# Patient Record
Sex: Female | Born: 1940 | Race: White | Hispanic: No | Smoking: Never smoker
Health system: Southern US, Community
[De-identification: ages and names within clinical notes are randomized; demographics above are authoritative.]

## PROBLEM LIST (undated history)

## (undated) DIAGNOSIS — E119 Type 2 diabetes mellitus without complications: Secondary | ICD-10-CM

## (undated) DIAGNOSIS — G4733 Obstructive sleep apnea (adult) (pediatric): Secondary | ICD-10-CM

## (undated) DIAGNOSIS — E7800 Pure hypercholesterolemia, unspecified: Secondary | ICD-10-CM

## (undated) DIAGNOSIS — C801 Malignant (primary) neoplasm, unspecified: Secondary | ICD-10-CM

## (undated) DIAGNOSIS — M1990 Unspecified osteoarthritis, unspecified site: Secondary | ICD-10-CM

## (undated) DIAGNOSIS — I10 Essential (primary) hypertension: Secondary | ICD-10-CM

## (undated) DIAGNOSIS — N189 Chronic kidney disease, unspecified: Secondary | ICD-10-CM

## (undated) DIAGNOSIS — Z9989 Dependence on other enabling machines and devices: Secondary | ICD-10-CM

## (undated) DIAGNOSIS — R269 Unspecified abnormalities of gait and mobility: Secondary | ICD-10-CM

## (undated) DIAGNOSIS — R32 Unspecified urinary incontinence: Secondary | ICD-10-CM

## (undated) HISTORY — DX: Type 2 diabetes mellitus without complications: E11.9

## (undated) HISTORY — PX: TONSILLECTOMY: SUR1361

## (undated) HISTORY — DX: Malignant (primary) neoplasm, unspecified: C80.1

## (undated) HISTORY — DX: Unspecified abnormalities of gait and mobility: R26.9

## (undated) HISTORY — DX: Pure hypercholesterolemia, unspecified: E78.00

## (undated) HISTORY — DX: Unspecified urinary incontinence: R32

## (undated) HISTORY — PX: WISDOM TOOTH EXTRACTION: SHX21

## (undated) HISTORY — DX: Essential (primary) hypertension: I10

## (undated) HISTORY — DX: Unspecified osteoarthritis, unspecified site: M19.90

## (undated) HISTORY — PX: REPLACEMENT TOTAL KNEE: SUR1224

## (undated) HISTORY — DX: Obstructive sleep apnea (adult) (pediatric): Z99.89

## (undated) HISTORY — DX: Obstructive sleep apnea (adult) (pediatric): G47.33

## (undated) HISTORY — DX: Chronic kidney disease, unspecified: N18.9

## (undated) HISTORY — PX: CARPAL TUNNEL RELEASE: SHX101

## (undated) HISTORY — PX: TOTAL SHOULDER REPLACEMENT: SUR1217

---

## 1974-07-22 HISTORY — PX: ABDOMINAL HYSTERECTOMY: SHX81

## 1988-07-22 HISTORY — PX: FOOT TENDON SURGERY: SHX958

## 2000-07-22 HISTORY — PX: BREAST LUMPECTOMY: SHX2

## 2001-02-06 ENCOUNTER — Encounter: Payer: Self-pay | Admitting: General Surgery

## 2001-02-06 ENCOUNTER — Encounter (INDEPENDENT_AMBULATORY_CARE_PROVIDER_SITE_OTHER): Payer: Self-pay | Admitting: Specialist

## 2001-02-06 ENCOUNTER — Ambulatory Visit (HOSPITAL_BASED_OUTPATIENT_CLINIC_OR_DEPARTMENT_OTHER): Admission: RE | Admit: 2001-02-06 | Discharge: 2001-02-06 | Payer: Self-pay | Admitting: General Surgery

## 2001-05-06 ENCOUNTER — Ambulatory Visit: Admission: RE | Admit: 2001-05-06 | Discharge: 2001-08-04 | Payer: Self-pay | Admitting: *Deleted

## 2001-05-26 ENCOUNTER — Ambulatory Visit (HOSPITAL_BASED_OUTPATIENT_CLINIC_OR_DEPARTMENT_OTHER): Admission: RE | Admit: 2001-05-26 | Discharge: 2001-05-26 | Payer: Self-pay | Admitting: General Surgery

## 2002-01-14 ENCOUNTER — Other Ambulatory Visit: Admission: RE | Admit: 2002-01-14 | Discharge: 2002-01-14 | Payer: Self-pay | Admitting: Family Medicine

## 2003-05-31 ENCOUNTER — Ambulatory Visit (HOSPITAL_BASED_OUTPATIENT_CLINIC_OR_DEPARTMENT_OTHER): Admission: RE | Admit: 2003-05-31 | Discharge: 2003-05-31 | Payer: Self-pay | Admitting: Family Medicine

## 2003-05-31 ENCOUNTER — Encounter: Payer: Self-pay | Admitting: Pulmonary Disease

## 2004-06-07 ENCOUNTER — Ambulatory Visit: Payer: Self-pay | Admitting: Family Medicine

## 2004-06-21 ENCOUNTER — Ambulatory Visit: Payer: Self-pay | Admitting: Family Medicine

## 2004-06-26 ENCOUNTER — Ambulatory Visit: Payer: Self-pay | Admitting: Family Medicine

## 2004-06-29 ENCOUNTER — Ambulatory Visit: Payer: Self-pay | Admitting: Oncology

## 2004-09-25 ENCOUNTER — Ambulatory Visit: Payer: Self-pay | Admitting: Family Medicine

## 2004-12-02 ENCOUNTER — Encounter: Admission: RE | Admit: 2004-12-02 | Discharge: 2004-12-02 | Payer: Self-pay | Admitting: Specialist

## 2004-12-19 ENCOUNTER — Ambulatory Visit: Payer: Self-pay | Admitting: Family Medicine

## 2005-01-03 ENCOUNTER — Ambulatory Visit: Payer: Self-pay | Admitting: Oncology

## 2005-06-05 ENCOUNTER — Ambulatory Visit: Payer: Self-pay | Admitting: Family Medicine

## 2005-06-26 ENCOUNTER — Encounter: Payer: Self-pay | Admitting: Specialist

## 2005-06-28 ENCOUNTER — Ambulatory Visit: Payer: Self-pay | Admitting: Oncology

## 2005-07-01 ENCOUNTER — Inpatient Hospital Stay (HOSPITAL_COMMUNITY): Admission: RE | Admit: 2005-07-01 | Discharge: 2005-07-05 | Payer: Self-pay | Admitting: Specialist

## 2005-07-17 ENCOUNTER — Ambulatory Visit: Payer: Self-pay | Admitting: Family Medicine

## 2005-09-09 ENCOUNTER — Ambulatory Visit: Payer: Self-pay | Admitting: Family Medicine

## 2006-01-23 ENCOUNTER — Encounter: Admission: RE | Admit: 2006-01-23 | Discharge: 2006-01-23 | Payer: Self-pay | Admitting: Specialist

## 2006-07-04 ENCOUNTER — Ambulatory Visit: Payer: Self-pay | Admitting: Oncology

## 2007-01-05 ENCOUNTER — Ambulatory Visit: Payer: Self-pay | Admitting: Oncology

## 2007-11-03 DIAGNOSIS — I10 Essential (primary) hypertension: Secondary | ICD-10-CM

## 2007-11-03 DIAGNOSIS — G4733 Obstructive sleep apnea (adult) (pediatric): Secondary | ICD-10-CM

## 2007-11-03 DIAGNOSIS — C50919 Malignant neoplasm of unspecified site of unspecified female breast: Secondary | ICD-10-CM | POA: Insufficient documentation

## 2007-11-04 ENCOUNTER — Ambulatory Visit: Payer: Self-pay | Admitting: Pulmonary Disease

## 2007-11-04 DIAGNOSIS — J309 Allergic rhinitis, unspecified: Secondary | ICD-10-CM | POA: Insufficient documentation

## 2007-11-04 DIAGNOSIS — E782 Mixed hyperlipidemia: Secondary | ICD-10-CM | POA: Insufficient documentation

## 2007-11-04 DIAGNOSIS — E1140 Type 2 diabetes mellitus with diabetic neuropathy, unspecified: Secondary | ICD-10-CM | POA: Insufficient documentation

## 2007-12-09 ENCOUNTER — Ambulatory Visit: Payer: Self-pay | Admitting: Pulmonary Disease

## 2007-12-18 ENCOUNTER — Encounter: Payer: Self-pay | Admitting: Pulmonary Disease

## 2008-02-09 ENCOUNTER — Ambulatory Visit: Payer: Self-pay | Admitting: Pulmonary Disease

## 2008-05-16 ENCOUNTER — Ambulatory Visit: Payer: Self-pay | Admitting: Pulmonary Disease

## 2009-06-02 DIAGNOSIS — D649 Anemia, unspecified: Secondary | ICD-10-CM | POA: Insufficient documentation

## 2009-06-19 ENCOUNTER — Inpatient Hospital Stay (HOSPITAL_COMMUNITY): Admission: RE | Admit: 2009-06-19 | Discharge: 2009-06-21 | Payer: Self-pay | Admitting: Orthopedic Surgery

## 2010-10-23 LAB — GLUCOSE, CAPILLARY: Glucose-Capillary: 117 mg/dL — ABNORMAL HIGH (ref 70–99)

## 2010-10-23 LAB — CBC
HCT: 33.4 % — ABNORMAL LOW (ref 36.0–46.0)
MCHC: 33.9 g/dL (ref 30.0–36.0)
MCV: 93.2 fL (ref 78.0–100.0)

## 2010-10-23 LAB — BASIC METABOLIC PANEL
BUN: 11 mg/dL (ref 6–23)
CO2: 32 mEq/L (ref 19–32)
Chloride: 102 mEq/L (ref 96–112)
Glucose, Bld: 141 mg/dL — ABNORMAL HIGH (ref 70–99)
Potassium: 3.5 mEq/L (ref 3.5–5.1)
Sodium: 140 mEq/L (ref 135–145)

## 2010-10-24 LAB — DIFFERENTIAL
Eosinophils Absolute: 0.3 10*3/uL (ref 0.0–0.7)
Eosinophils Relative: 4 % (ref 0–5)
Lymphocytes Relative: 24 % (ref 12–46)
Lymphs Abs: 1.9 10*3/uL (ref 0.7–4.0)
Monocytes Relative: 9 % (ref 3–12)

## 2010-10-24 LAB — COMPREHENSIVE METABOLIC PANEL
ALT: 23 U/L (ref 0–35)
Albumin: 4 g/dL (ref 3.5–5.2)
Alkaline Phosphatase: 85 U/L (ref 39–117)
BUN: 19 mg/dL (ref 6–23)
Creatinine, Ser: 0.94 mg/dL (ref 0.4–1.2)
GFR calc Af Amer: >60 mL/min (ref >60)
GFR calc non Af Amer: 60 mL/min — ABNORMAL LOW (ref >60)
Potassium: 3.9 mEq/L (ref 3.5–5.1)
Total Bilirubin: 0.3 mg/dL (ref 0.3–1.2)

## 2010-10-24 LAB — URINALYSIS, ROUTINE W REFLEX MICROSCOPIC
Bilirubin Urine: NEGATIVE
Ketones, ur: NEGATIVE mg/dL
Protein, ur: NEGATIVE mg/dL
Specific Gravity, Urine: 1.016 (ref 1.005–1.030)
pH: 5.5 (ref 5.0–8.0)

## 2010-10-24 LAB — URINE MICROSCOPIC-ADD ON

## 2010-10-24 LAB — TYPE AND SCREEN
ABO/RH(D): O POS
Antibody Screen: NEGATIVE

## 2010-10-24 LAB — CBC
HCT: 33.9 % — ABNORMAL LOW (ref 36.0–46.0)
MCHC: 34.2 g/dL (ref 30.0–36.0)
RBC: 3.64 MIL/uL — ABNORMAL LOW (ref 3.87–5.11)
WBC: 5.4 10*3/uL (ref 4.0–10.5)

## 2010-10-24 LAB — APTT: aPTT: 33 seconds (ref 24–37)

## 2010-10-24 LAB — BASIC METABOLIC PANEL
CO2: 27 mEq/L (ref 19–32)
GFR calc Af Amer: >60 mL/min (ref >60)
GFR calc non Af Amer: 54 mL/min — ABNORMAL LOW (ref >60)
Glucose, Bld: 122 mg/dL — ABNORMAL HIGH (ref 70–99)
Potassium: 3.7 mEq/L (ref 3.5–5.1)

## 2010-10-24 LAB — GLUCOSE, CAPILLARY
Glucose-Capillary: 118 mg/dL — ABNORMAL HIGH (ref 70–99)
Glucose-Capillary: 130 mg/dL — ABNORMAL HIGH (ref 70–99)
Glucose-Capillary: 136 mg/dL — ABNORMAL HIGH (ref 70–99)
Glucose-Capillary: 90 mg/dL (ref 70–99)

## 2010-10-24 LAB — PROTIME-INR: Prothrombin Time: 13.3 seconds (ref 11.6–15.2)

## 2010-12-07 NOTE — Consult Note (Signed)
NAMELILLYMAE, DUET NO.:  [id number]   MEDICAL RECORD NO.:  [id number]          PATIENT TYPE:  INP   LOCATION:  1505                         FACILITY:  [facility]   PHYSICIAN:  Elliot Cousin, M.D.    DATE OF BIRTH:  06-28-1941   DATE OF CONSULTATION:  07/01/2005  DATE OF DISCHARGE:                                   CONSULTATION   REFERRING PHYSICIAN:  Erasmo Leventhal, M.D.   PRIMARY CARE PHYSICIAN:  Dr. Sol Passer, [place], [place].   REASON FOR CONSULTATION:  Diabetes and hypertension management.   HISTORY OF PRESENT ILLNESS:  [name] is a 70 year old lady with a past  medical history significant for degenerative joint disease, diabetes  mellitus, and hypertension, who was admitted by Dr. Thomasena Edis on July 01, 2005 for surgical intervention of the patient's severe left knee  degenerative joint disease.  The patient is now status post total left knee  arthroplasty.  The patient is currently lying in bed and is stable.  She  currently complains of left leg pain and diffuse itching.  The pain is being  managed with a PCA morphine pump.  The etiology of the patient's itching is  unclear at this time; however, the patient has no visible rash or swelling.  No difficulty swallowing or breathing.  She is currently itching all over.   The patient was diagnosed with type 2 diabetes mellitus approximately one  year ago.  She is treated with Avandia 8 mg daily as an outpatient.  Her  capillary blood sugars at home generally range between 100-130.  She is  unaware of her hemoglobin A1C.  She has had no recent history of  hypoglycemic episodes.  No history of diabetic retinopathy, neuropathy, and  nephropathy.  No history of diabetic foot ulcers.  The patient also has  hypertension, diagnosed over 30 years ago.  The patient is treated  chronically with Avalide and verapamil.  She says that her blood pressure  has been mildly elevated lately; however, in general,  her blood pressures  have been well controlled.  The patient has no history of myocardial  infarction, angina, or heart failure.   PAST MEDICAL HISTORY:  1.  Type 2 diabetes mellitus, diagnosed one year ago.  2.  Hypertension, diagnosed 30 years ago.  3.  Outpatient Cardiolite stress test, negative.  Ejection fraction 62%,      June, 2006.  4.  Hypothyroidism.  5.  Degenerative joint disease.  6.  Obesity.  7.  Hyperlipidemia.  8.  Depression.  9.  Right breast cancer, 2002, status post lumpectomy, chemotherapy, and      radiation therapy.  10. Status post left rotator cuff surgery in the past.  11. Status post right carpal tunnel surgery in the past.  12. Status post right foot tendon surgery in the past.  13. Status post total abdominal hysterectomy.   ALLERGIES:  No known drug allergies.   MEDICATIONS AT HOME:  1.  Avalide 300/12.5 mg daily.  2.  Verapamil 120 mg q.h.s.  3.  Lipitor 20 mg q.h.s.  4.  Synthroid 25 mcg 1-1/2 tablets q.a.m.  5.  Lexapro 10 mg daily.  6.  Mobic 1 tablet q.a.m.  7.  Tylenol as needed.  8.  Avandia 8 mg q.a.m.   SOCIAL HISTORY:  The patient is married.  She has no children.  She [place].  She is a retired Runner, broadcasting/film/video.  She  denies tobacco, alcohol, or drug use.   FAMILY HISTORY:  Her family history is positive for coronary artery disease,  hypertension, diabetes mellitus, cancer, strokes, and degenerative joint  disease.   REVIEW OF SYSTEMS:  Her review of systems is positive for chronic shortness  of breath, chronic bilateral knee pain.  Otherwise, review of systems is  negative.   PHYSICAL EXAMINATION:  VITAL SIGNS:  Temperature 97.1, pulse 59, respiratory  rate 20, blood pressure 131/71.  Oxygen saturation 96% on 2 liters.  Capillary blood sugar 99.  GENERAL:  The patient is an obese 70 year old Caucasian lady who is  currently lying in bed in some distress secondary to left knee pain and   itching.  HEENT:  Head is normocephalic and atraumatic.  Pupils are equal, round and  reactive to light.  Extraocular movements are intact.  Conjunctivae are  clear.  Sclerae are white.  Tympanic membranes are not examined.  Oropharynx  reveals moist mucous membranes.  Teeth are in good repair.  No posterior  exudate or erythema.  Nasal mucosa is moist.  No sinus tenderness.  NECK:  Supple and obese.  No adenopathy.  No thyromegaly.  No bruits.  No  JVD .  LUNGS:  Clear to auscultation with decreased breath sounds in the bases.  HEART:  S1 and S2 with an occasional ectopic beat.  CHEST WALL:  A well-healed right breast scar.  No masses palpated.  ABDOMEN:  Obese.  Positive bowel sounds.  Soft, nontender, nondistended.  No  hepatosplenomegaly.  No masses palpated.  EXTREMITIES:  The patient has a left immobilizer in place.  She is able to  wiggle her toes.  Her right leg is without edema.  Pedal pulses barely  palpable bilaterally.  NEUROLOGIC:  The patient is alert and oriented x3.  Cranial nerves II-XII  are intact.  Sensation is intact.  Strength is 5/5 throughout with the  exception of the left lower extremity, which was not tested.  SKIN:  No visible rash or erythema or ecchymosis.   LABS:  EKG reveals a normal sinus rhythm with a heart rate of 61 beats per  minute.  No acute abnormalities.   Chest x-ray on June 26, 2005 revealed thoracolumbar scoliosis.   No current lab data available today.  The results of the lab data on  December 6th was unremarkable.   ASSESSMENT:  1.  Type 2 diabetes mellitus:  The patient is treated chronically with      Avandia.  Her capillary blood sugars today have been well controlled.      By history, she has good control of her diabetes mellitus.  A hemoglobin      A1C will need to be assessed.  2.  Hypertension:  The patient's blood pressures are currently controlled on     verapamil and Avalide.  3.  Hypothyroidism:  The patient has been  stable on Synthroid.  A TSH will      need to be assessed.  4.  Bradycardia:  The bradycardia is mild and probably secondary to      verapamil.  5.  Hyperlipidemia:  The patient is treated chronically with Lipitor.  6.  Pruritus:  The etiology of the patient's pruritus is unknown.  No      evidence of rash.  Perhaps, the patient may be experiencing a reaction      to the bed sheets, anesthesia, and/or a medication.  7.  Status post left total knee arthroplasty, per Dr. Thomasena Edis.   RECOMMENDATIONS:  1.  Change the sliding-scale insulin regimen to q.a.c.  Hold on q.h.s.      insulin therapy for now to avoid hypoglycemia.  Continue Avandia at      present dose.  Will hold Avandia for capillary blood sugar of less than      100.  Will check a hemoglobin A1C.  2.  Change the patient's diet to a moderate carbohydrate controlled diet.  3.  Continue antihypertensive medication.  Hold verapamil for a heart rate      of less than 58.  4.  Assess the patient's TSH, fasting lipid panel, hemoglobin A1C, and basic      labs in the a.m.  5.  Benadryl p.r.n., topical steroid ointment, and hypoallergenic sheets to      treat the itching.   Thank you for the consultation.  Will follow with you.      Elliot Cousin, M.D.  Electronically Signed     DF/MEDQ  D:  07/02/2005  T:  07/02/2005  Job:  [id number]

## 2010-12-07 NOTE — H&P (Signed)
[name], [name] NO.:  [id number]   MEDICAL RECORD NO.:  [id number]          PATIENT TYPE:  INP   LOCATION:  NA                           FACILITY:  [facility]   PHYSICIAN:  Erasmo Leventhal, M.D.DATE OF BIRTH:  04-27-1941   DATE OF ADMISSION:  DATE OF DISCHARGE:                                HISTORY & PHYSICAL   CHIEF COMPLAINT:  Left knee osteoarthritis.   HISTORY OF PRESENT ILLNESS:  This is a 70 year old lady with a history of  end-stage osteoarthritis of her left knee with failure of conservative  treatment to manage her pain. She denies problems with activities of daily  living and is now scheduled for total knee arthroplasty of the left knee.  The risks and benefits of the surgery are discussed in detail with the  patient and questions are invited and answered. She has had medical  clearance from her medical doctor. Her surgery will go ahead as scheduled.  Her medical doctor is Dr. Sol Passer who is out of town; if we need help in the  hospital we will get hospital consultation with the hospitalists.   PAST MEDICAL HISTORY:  Drug allergies:  None. Current medications:  Avalide,  verapamil, Lipitor, Synthroid, Lexapro, Mobic, Avandia. The patient did not  bring her medicines or dosages with her today, she will bring those to the  hospital with her. Previous surgeries include rotator cuff repair on the  left, carpal tunnel release on the right, right foot tendon repair, and  hysterectomy. Serious medical illnesses include diabetes, hypertension,  hypothyroidism, history of breast cancer.   FAMILY HISTORY:  Positive for coronary artery disease, hypertension,  diabetes, cancer, CVA, and osteoarthritis.   SOCIAL HISTORY:  The patient is married, she is retired, she lives at home.  She does not smoke or drink.   REVIEW OF SYSTEMS:  CENTRAL NERVOUS SYSTEM:  Negative for headache, blurred  or double vision, dizziness. PULMONARY:  Positive for exertional  shortness  of breath. Negative for PND and orthopnea. CARDIOVASCULAR:  Negative for  chest pain, palpitations. GI:  Negative for ulcers, hepatitis. GU:  Negative  for urinary tract difficulty. MUSCULOSKELETAL:  Positive as in HPI.   PHYSICAL EXAMINATION:  VITAL SIGNS:  Blood pressure 146/96, respirations 18,  pulse 78 and regular.  GENERAL APPEARANCE:  This is an obese white female in no acute distress.  HEENT:  Head normocephalic. Nose patent, ears patent. Pupils equal, round,  and reactive to light. Throat without injection.  NECK:  Supple without adenopathy. Carotids 2+ without bruit.  CHEST:  Clear to auscultation. No rales or rhonchi. Respirations 18. She  does have scoliosis with a right rib hump.  HEART:  Regular rate and rhythm at 78 beats per minute without murmur.  ABDOMEN:  Soft with active bowel sounds. No masses or organomegaly.  NEUROLOGIC:  The patient alert and oriented to time, place, and person.  Cranial nerves II-XII grossly intact.  EXTREMITIES:  Showed the left knee with 0-110 degree range of motion.  Neurovascular status is intact. Dorsalis pedis and posterior tibialis pulses  are 1+ and symmetrical.  X-rays show end-stage osteoarthritis of the left knee.   IMPRESSION:  End-stage osteoarthritis of the left knee.   PLAN:  Total knee arthroplasty of the left knee.      [name], P.A.    ______________________________  Erasmo Leventhal, M.D.    SJC/MEDQ  D:  06/28/2005  T:  06/28/2005  Job:  [id number]

## 2010-12-07 NOTE — Discharge Summary (Signed)
NAMEJUDENE, LOGUE NO.:  [id number]   MEDICAL RECORD NO.:  [id number]          PATIENT TYPE:  INP   LOCATION:  1505                         FACILITY:  [facility]   PHYSICIAN:  Erasmo Leventhal, M.D.DATE OF BIRTH:  [birth year]   DATE OF ADMISSION:  07/01/2005  DATE OF DISCHARGE:  07/05/2005                                 DISCHARGE SUMMARY   ADMISSION DATE:  End-stage osteoarthritis of the left knee.   DISCHARGE DIAGNOSIS:  End-stage osteoarthritis of the left knee.   OPERATION:  Total arthroplasty of the left knee.   BRIEF HISTORY:  This is a 70 year old lady well known to [place] with a history  of end-stage osteoarthritis of her left knee who has had failure of  conservative management to alleviate her symptoms due to continued symptoms.  She is now scheduled for total knee arthroplasty of the left knee.  The  risks and benefits of the surgery were discussed in detail to the patient.  Questions were answered and surgery to go ahead as scheduled.   LABORATORY VALUES:  Admission CBC within normal limits.  Admission PT/PTT  within normal limits.  Admission CMET within normal limits with the  exception of a mildly elevated glucose of 103.  Her hemoglobin and  hematocrit a low of 9.8 and 28.8 on July 03, 2005 and were back up to  10.7 and 31.2 on July 04, 2005.  She had a mildly elevated WBC on  July 04, 2005 at 10.8.  PT-INR was 2.8 at discharge.  She did have a  little bout of hyponatremia which was corrected with fluid management and  glucose remained mildly elevated throughout admission.  Hemoglobin A1C was  5.8.   COURSE IN THE HOSPITAL:  The patient tolerated the operative procedure well.  On the first postoperative day vital signs were stable.  Temperature max  99.5.  Drains removed with difficulty.  Hemoglobin 10.7, hematocrit 31.9.  Lungs were clear.  Bowel sounds active.  Counts were negative.  Dressing  dry.  The patient did not let us know  preoperatively that she has sleep  apnea and she did let us know postoperatively and so she was started on her  CPAP machine and minimized narcotics.  Also due to the patient's elevated  glucose medical consult was obtained for evaluation and management of her  hypertension as well.  On the second postoperative day the vital signs were  stable.  Temp to a max of 100.3, hemoglobin 9.8, hematocrit 28.8.  He did  have mild hyponatremia at 132.  Lungs were clear.  Bowel sounds sluggish.  Heart sounds normal.  Calves negative.  Dressing was changed.  The wound was  benign.  She was placed on a 1200 mL fluid restriction and given K-Dur for  her mild hypokalemia and continued physical therapy.  On the third  postoperative day vital signs were stable, temp to a maximum of 100, O2 94%  on room air.  WBC mildly elevated at 10.8, hemoglobin and hematocrit were  stable.  Hyponatremia and hypokalemia had corrected and the patient  continued on physical  therapy.  On the fourth postoperative day she was  feeling good.  Vital signs were stable.  She was afebrile.  Wound was  benign.  Calves were negative.  Lungs clear.  Bowel sounds sluggish.  Heart  sounds normal.  The patient was subsequently discharged home for follow up  in the office.   CONDITION ON DISCHARGE:  Improved.   DISCHARGE MEDICATIONS:  Improved.   DISCHARGE MEDICATIONS:  Percocet 5/325 one to two q.4-6h. as needed pain,  Robaxin 500 one by mouth q.8h. as needed spasm, Trinsicon 1 twice a day for  anemia and Coumadin per pharmacy protocol.   DISCHARGE INSTRUCTIONS:  She is to do her home physical therapy as directed.  Recheck with Dr. Thomasena Edis in 10 days, recheck with Dr. Sol Passer in one week and  call also if she has any problems or questions.      Jaquelyn Bitter. Chabon, P.A.    ______________________________  Erasmo Leventhal, M.D.    SJC/MEDQ  D:  07/29/2005  T:  07/29/2005  Job:  [id number]

## 2010-12-07 NOTE — Op Note (Signed)
[place]  Patient:    [name], [name]                     MRN: [id number] Proc. Date: 02/06/01 Adm. Date:  [id number] Attending:  Janalyn Rouse CC:         Dellia Beckwith, M.D., [place]  Dr. Adaline Sill, [place]   Operative Report  PREOPERATIVE DIAGNOSIS:  T1C carcinoma of the right breast.  POSTOPERATIVE DIAGNOSIS:  T1C carcinoma of the right breast.  PROCEDURES: 1. Right axillary sentinel lymph node biopsy with blue dye injection. 2. Insertion of Port-A-Cath.  SURGEON:  Rose Phi. Maple Hudson, M.D.  ANESTHESIA:  General.  INDICATION FOR PROCEDURE:  This patient had had a previous excisional biopsy of a 1.8 cm carcinoma of the right breast and clean margins.  She was referred for evaluation for sentinel node biopsy for axillary evaluation and to place a Port-A-Cath.  DESCRIPTION OF PROCEDURE:  Prior to coming to the operating room, 1 mCi of Technetium sulfur colloid was injected intradermally around the periareolar area.  After she was put to sleep, 5 cc of lymphazurin blue was injected in the subareolar tissue and the breast compressed for five minutes.  With her asleep and the dye injected, we prepped the right breast and right axilla.  We scanned the right axilla with the NeoProbe, and she had one hot spot.  A short transverse right axillary incision was made with dissection through the subcutaneous tissue to the clavipectoral fascia.  Hemostasis obtained with the cautery.  Using the NeoProbe as a guide, we opened up the clavipectoral fascia and identified a hot and blue lymph node.  Actually there were two nodes, which we removed.  The lymphatics were clipped.  This was submitted as two sentinel nodes to the pathologist.  There were no other blue or hot or palpable nodes.  While that was being done, we closed the incision with 3-0 Vicryl and staples. Touch prep showed these to be negative.  We then took  the drapes off and re-prepped and draped the patient with arms down by the side and a roll between the shoulders and prepped the left upper chest and neck.  A left subclavian puncture was carried out without difficulty and the guidewire inserted and proper positioning confirmed on fluoroscopy.  A transverse incision was made on the anterior chest wall, and we developed a pocket for the implantable port.  A pre-connected Davol catheter was used, and we tunneled between the subclavian puncture site and the port and then passed the preattached catheter through that.  I then anchored the port in the pocket with two 2-0 Prolene sutures.  The dilator and peel-away sheath were then passed over the wire and then the wire removed and the dilator removed, and the catheter, after having trimmed it to the appropriate length, was passed through the peel-away sheath and then it was removed.  We then confirmed that there was no kinking and that the system flushed well and that the catheter tip was in the appropriate place by fluoroscopy.  The incisions were closed with 3-0 Vicryl and subcuticular 4-0 Monocryl and Steri-Strips.  The system was fully heparinized.  Dressings were applied and the patient was transferred to the recovery room in satisfactory condition, having tolerated the procedure well. DD:  02/06/01 TD:  02/09/01 Job: [id number]

## 2010-12-07 NOTE — Op Note (Signed)
[name], PAOLO NO.:  [id number]   MEDICAL RECORD NO.:  [id number]          PATIENT TYPE:  INP   LOCATION:  1505                         [facility]ACILITY:  [facility]   PHYSICIAN:  Erasmo Leventhal, M.D.DATE OF BIRTH:  1941/01/07   DATE OF PROCEDURE:  07/01/2005  DATE OF DISCHARGE:                                 OPERATIVE REPORT   PREOPERATIVE DIAGNOSIS:  Left knee end-stage osteoarthritis.   POSTOPERATIVE DIAGNOSIS:  Left knee end-stage osteoarthritis.   PROCEDURE:  Left total knee arthroplasty.   SURGEON:  Erasmo Leventhal, M.D.   ASSISTANT:  Jaquelyn Bitter. Chabon, PA-C.   ANESTHESIA:  Spinal.   ESTIMATED BLOOD LOSS:  100 cc.   DRAINS:  One medium Hemovac.   COMPLICATIONS:  None.   TOURNIQUET TIME:  One hour, 40 minutes at 400 mmHg.   COMPLICATIONS:  None.   DISPOSITION:  To PACU, stable.   OPERATIVE IMPLANTS:  A [employer], size  5 femur, size 5 tibia, a 12.5 mm rotating platform tibial insertion with a  38 mm all polyethylene patella, all cemented.   OPERATIVE DETAILS:  Patient was counseled in the holding area.  The correct  side was identified.  IV was started.  Antibiotics were given.  Taken to the  operating room.  Placed in supine position, where a spinal anesthetic was  administered.  Following this, a Foley catheter was placed, utilizing  sterile technique by the OR circulating nurse.  All extremities were well  padded and bumped.  The left knee was examined.  A 5 degree flexion  contracture, flexed at 110 degrees.  Elevated.  Prepped with Hibiclens.  Draped into a sterile fashion.  Exsanguinated.  Esmarch.  Tourniquet was  inflated to 400 mmHg due to the large size of her thigh.  A straight midline  incision was made through the skin and subcutaneous tissue.  Medial and  lateral soft tissue flaps were developed at the appropriate levels.  The  small vein was electrocoagulated.  Medial parapatellar  arthrotomy was  performed.  The patella was everted.  The knee was flexed.  End-stage  arthritic changes.  Bone against bone contact.  Cruciate ligaments were  resected.  A starting hole made in the distal femur.  The canal was  irrigated.  Effluent was clear.  The intramedullary rod was gently placed.  I chose a 5 degree valgus cut with an 11 mm cut off the distal femur due to  her flexion contracture.  The distal femur was found to be a size 5.  Rotational marks were made.  The distal femur was cut to fit a size 5.  Medial and lateral menisci are removed.  __________ vessels are coagulated.  Posterior neurovascular structures were thawed off and protected throughout  the entire case.  The tibial eminence was resected.  The proximal tibia was  found to be a size 5.  The starter hole was made.  The canal was irrigated.  The effluent was clear.  The step reamer was utilized.  The intramedullary  rod was gently placed.  I chose a 0 degree slope with a 10 mm cut based upon  the lateral side.  Posterior medial and posterior femoral osteophytes were  removed under direct visualization.  The posterior neurovascular structures  were thawed off and protected throughout the entire case.  We checked with  flexion and extension blocks.  We had an excellent, well-balanced knee with  10 degree blocks and flexion and extension.  The knee was then flexed.  The  tibial tray was in place.  Rotation coverage was set.  The reamer was  utilized and the punch.  The distal femoral box cut was then performed in a  standard fashion.  At this time, a size 5 femur, size 5 tibia and 10 insert  with excellent flexion and extension gaps and a nice soft tissue balance.  The patella is found to be a size 38, and 8 mm was resected.  Locking holes  were made.  The patella tracked anatomically without any type of thumb  pressure at this time.  All trials were then removed, utilizing modern  cement technique.  All components  were cemented into place.  A size 5 tibia,  size 5 femur, a 38 all poly patella.  After cement had cured, excess cement  was removed.  With a 10 and a 12.5 mm tibial insert, we now had excellent  flexion and extension gaps.  The patellofemoral tracking was anatomic.  Well  balanced varus and valgus stress, __________ degrees of flexion, 9 degrees  of flexion.  The trial well-appearing removed.  The knee was irrigated  again.  A final 12.5 mm rotating platform tibial insert was implanted.  Again, the knee well balanced and checked throughout.  Irrigated again.  A  drain was placed in the left intra-articular.  Sequential closure of layers  was done.  The arthrotomy Vicryl.  Irrigated with saline.  Subcu Vicryl.  Irrigated with saline.  Skin closed with staples.  Sterile dressing was  applied to the knee.  Ace wraps were applied.  The tourniquet was deflated.  Normal circulation of the foot and ankle at the end of the case.  Ice pack  was applied.  The knee immobilizer in full extension.  She tolerated the  procedure well.  There were no complications or problems.  She was taken to  the operating room and to the PACU in stable condition.   For help with surgical technique and retraction throughout the entire case,  Mr. Brett Canales Chabon's, PA-C, assistance was needed.  A difficult situation, due  to her morbid obesity, with excellent results at this time.           ______________________________  Erasmo Leventhal, M.D.     RAC/MEDQ  D:  07/01/2005  T:  07/01/2005  Job:  [id number]

## 2014-10-27 DIAGNOSIS — Z6841 Body Mass Index (BMI) 40.0 and over, adult: Secondary | ICD-10-CM

## 2014-10-27 DIAGNOSIS — E039 Hypothyroidism, unspecified: Secondary | ICD-10-CM | POA: Insufficient documentation

## 2014-10-27 HISTORY — DX: Morbid (severe) obesity due to excess calories: E66.01

## 2014-12-27 ENCOUNTER — Other Ambulatory Visit: Payer: Self-pay | Admitting: Orthopedic Surgery

## 2014-12-27 DIAGNOSIS — Z96611 Presence of right artificial shoulder joint: Secondary | ICD-10-CM

## 2015-01-02 ENCOUNTER — Ambulatory Visit
Admission: RE | Admit: 2015-01-02 | Discharge: 2015-01-02 | Disposition: A | Discharge status: Home / Self Care | Payer: TRICARE For Life (TFL) | Source: Ambulatory Visit | Attending: Orthopedic Surgery | Admitting: Orthopedic Surgery

## 2015-01-02 ENCOUNTER — Ambulatory Visit
Admission: RE | Admit: 2015-01-02 | Discharge: 2015-01-02 | Disposition: A | Discharge status: Home / Self Care | Payer: Medicare Other | Source: Ambulatory Visit | Attending: Orthopedic Surgery | Admitting: Orthopedic Surgery

## 2015-01-02 DIAGNOSIS — Z96611 Presence of right artificial shoulder joint: Secondary | ICD-10-CM

## 2015-08-17 DIAGNOSIS — Z853 Personal history of malignant neoplasm of breast: Secondary | ICD-10-CM | POA: Diagnosis not present

## 2016-01-11 DIAGNOSIS — F3340 Major depressive disorder, recurrent, in remission, unspecified: Secondary | ICD-10-CM | POA: Insufficient documentation

## 2016-01-11 HISTORY — DX: Major depressive disorder, recurrent, in remission, unspecified: F33.40

## 2016-07-14 DIAGNOSIS — N183 Chronic kidney disease, stage 3 unspecified: Secondary | ICD-10-CM

## 2016-07-14 DIAGNOSIS — Z853 Personal history of malignant neoplasm of breast: Secondary | ICD-10-CM

## 2016-07-14 HISTORY — DX: Personal history of malignant neoplasm of breast: Z85.3

## 2016-07-14 HISTORY — DX: Chronic kidney disease, stage 3 unspecified: N18.30

## 2016-07-22 HISTORY — PX: CATARACT EXTRACTION, BILATERAL: SHX1313

## 2016-12-02 ENCOUNTER — Encounter: Payer: Self-pay | Admitting: Diagnostic Neuroimaging

## 2016-12-02 ENCOUNTER — Ambulatory Visit (INDEPENDENT_AMBULATORY_CARE_PROVIDER_SITE_OTHER): Payer: Medicare Other | Admitting: Diagnostic Neuroimaging

## 2016-12-02 VITALS — BP 164/105 | HR 73 | Ht 64.5 in | Wt 271.0 lb

## 2016-12-02 DIAGNOSIS — G4733 Obstructive sleep apnea (adult) (pediatric): Secondary | ICD-10-CM | POA: Diagnosis not present

## 2016-12-02 DIAGNOSIS — Z6841 Body Mass Index (BMI) 40.0 and over, adult: Secondary | ICD-10-CM | POA: Diagnosis not present

## 2016-12-02 DIAGNOSIS — R269 Unspecified abnormalities of gait and mobility: Secondary | ICD-10-CM

## 2016-12-02 DIAGNOSIS — I10 Essential (primary) hypertension: Secondary | ICD-10-CM

## 2016-12-02 DIAGNOSIS — E236 Other disorders of pituitary gland: Secondary | ICD-10-CM | POA: Diagnosis not present

## 2016-12-02 NOTE — Patient Instructions (Signed)
Thank you for coming to see [place] at [facility]. I hope we have been able to provide you high quality care today.  You may receive a patient satisfaction survey over the next few weeks. We would appreciate your feedback and comments so that we may continue to improve ourselves and the health of our patients.  - I will check MRI brain and sleep study  - I will refer you to physical therapy  - follow up with Dr. Garlon Hatchet re: blood pressure and diabetes   ~~~~~~~~~~~~~~~~~~~~~~~~~~~~~~~~~~~~~~~~~~~~~~~~~~~~~~~~~~~~~~~~~  DR. Atreyu Mak'S GUIDE TO HAPPY AND HEALTHY LIVING These are some of my general health and wellness recommendations. Some of them may apply to you better than others. Please use common sense as you try these suggestions and feel free to ask me any questions.   ACTIVITY/FITNESS Mental, social, emotional and physical stimulation are very important for brain and body health. Try learning a new activity (arts, music, language, sports, games).  Keep moving your body to the best of your abilities. You can do this at home, inside or outside, the park, community center, gym or anywhere you like. Consider a physical therapist or personal trainer to get started. Consider the app Sworkit. Fitness trackers such as smart-watches, smart-phones or Fitbits can help as well.   NUTRITION Eat more plants: colorful vegetables, nuts, seeds and berries.  Eat less sugar, salt, preservatives and processed foods.  Avoid toxins such as cigarettes and alcohol.  Drink water when you are thirsty. Warm water with a slice of lemon is an excellent morning drink to start the day.  Consider these websites for more information The Nutrition Source (https://www.henry-hernandez.biz/) Precision Nutrition (WindowBlog.ch)   RELAXATION Consider practicing mindfulness meditation or other relaxation techniques such as deep breathing, prayer, yoga, tai  chi, massage. See website mindful.org or the apps Headspace or Calm to help get started.   SLEEP Try to get at least 7-8+ hours sleep per day. Regular exercise and reduced caffeine will help you sleep better. Practice good sleep hygeine techniques. See website sleep.org for more information.   PLANNING Prepare estate planning, living will, healthcare POA documents. Sometimes this is best planned with the help of an attorney. Theconversationproject.org and agingwithdignity.org are excellent resources.

## 2016-12-02 NOTE — Progress Notes (Signed)
[facility]  PATIENT: [name] DOB: 02/28/1941  REFERRING CLINICIAN: Beane, J HISTORY FROM: patient and husband  REASON FOR VISIT: new consult     HISTORICAL  CHIEF COMPLAINT:  Chief Complaint  Patient presents with  . 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  For past 6-8 months patient has had intermittent balance and gait difficulty. She has difficulty turning around, tends to fall down, sometimes uses a cane, sometimes has dizziness. Patient also has significant neck pain and headache. Patient went to orthopedic surgeon who ordered MRI cervical spine showing multilevel degenerative changes. Also incidental small posterior pituitary cyst was noted. Therefore patient referred to me for further evaluation.  Patient has history of sleep apnea, sleep study 2004 and AHI of 6. She was started on CPAP at that time. She was following with Dr. Gwenette Greet, last seen a 2009. She stopped using CPAP in 2015.  Patient also has history of hypertension, diabetes, depression, breast cancer, bilateral knee replacement, shoulder replacement, insomnia, depression.    REVIEW OF SYSTEMS: Full 14 system review of systems performed and negative with exception of: Weight gain fatigue sewing-like sensation eye pain blurred vision memory loss headache weakness difficulty swallowing insomnia depression joint pain urination problems.   ALLERGIES: Allergies  Allergen Reactions  . Metformin     Other reaction(s): Other (See Comments) dysegusia    HOME MEDICATIONS: No outpatient prescriptions prior to visit.   No facility-administered medications prior to visit.     PAST MEDICAL HISTORY: Past Medical  History:  Diagnosis Date  . Cancer ([place])    breast, 2002  . Diabetes mellitus without complication ([place])   . Hypercholesteremia   . Hypertension     PAST SURGICAL HISTORY: Past Surgical History:  Procedure Laterality Date  . ABDOMINAL HYSTERECTOMY  1976   total  . BREAST LUMPECTOMY Right 2002   chemo, radiation  . FOOT TENDON SURGERY Right 1990  . REPLACEMENT TOTAL KNEE     bilateral  . TOTAL SHOULDER REPLACEMENT Right     FAMILY HISTORY: Family History  Problem Relation Age of Onset  . Stroke Mother   . Heart failure Father   . Cancer Sister        breast  . Cancer Brother   . Cancer Other        leukkemia, GYN cancer  . Stroke Brother   . Heart attack Brother     SOCIAL HISTORY:  Social History   Social History  . Marital status: Married    Spouse name: Linsey  . Number of children: 0  . Years of education: 26   Occupational History  .      retired   Social History Main Topics  . Smoking status: Never Smoker  . Smokeless tobacco: Never Used  . Alcohol use No  . Drug use: No  . Sexual activity: Not on file   Other Topics Concern  . Not on file   Social History Narrative   Lives with husband   Caffeine - coffee, 20-3 cups, Diet Coke- 3, 12 oz daily     PHYSICAL EXAM  GENERAL EXAM/CONSTITUTIONAL: Vitals:  Vitals:   12/02/16 0815  BP: Marland Kitchen)  179/107  Pulse: 76  Weight: 271 lb (122.9 kg)  Height: 5' 4.5" (1.638 m)     Body mass index is 45.8 kg/m.  Visual Acuity Screening   Right eye Left eye Both eyes  Without correction:     With correction: 20/50 20/100      Patient is in no distress; well developed, nourished and groomed; neck is supple  CARDIOVASCULAR:  Examination of carotid arteries is normal; no carotid bruits  Regular rate and rhythm, no murmurs  Examination of peripheral vascular system by observation and palpation is normal  EYES:  Ophthalmoscopic exam of optic discs and posterior segments is normal; no  papilledema or hemorrhages  MUSCULOSKELETAL:  Gait, strength, tone, movements noted in Neurologic exam below  NEUROLOGIC: MENTAL STATUS:  No flowsheet data found.  awake, alert, oriented to person, place and time  recent and remote memory intact  normal attention and concentration  language fluent, comprehension intact, naming intact,   fund of knowledge appropriate  CRANIAL NERVE:   2nd - no papilledema on fundoscopic exam  2nd, 3rd, 4th, 6th - pupils equal and reactive to light, visual fields full to confrontation, extraocular muscles intact, no nystagmus  5th - facial sensation symmetric  7th - facial strength symmetric  8th - hearing intact  9th - palate elevates symmetrically, uvula midline  11th - shoulder shrug symmetric  12th - tongue protrusion midline  MOTOR:   normal bulk and tone, full strength in the BUE  SLIGHT DECR IN BLE (4+ AT HIP FLEX; OTHERWISE 5)  SENSORY:   normal and symmetric to light touch, temperature, vibration  DECR PP IN VIB IN FEET  COORDINATION:   finger-nose-finger, fine finger movements normal  REFLEXES:   deep tendon reflexes TRACE and symmetric  ABSENT AT KNEES AND ANKLES  GAIT/STATION:   narrow based gait; SLOW AND UNSTEADY    DIAGNOSTIC DATA (LABS, IMAGING, TESTING) - I reviewed patient records, labs, notes, testing and imaging myself where available.  Lab Results  Component Value Date   WBC 7.8 06/21/2009   HGB 11.3 (L) 06/21/2009   HCT 33.4 (L) 06/21/2009   MCV 93.2 06/21/2009   PLT 186 06/21/2009      Component Value Date/Time   NA 140 06/21/2009 0440   K 3.5 06/21/2009 0440   CL 102 06/21/2009 0440   CO2 32 06/21/2009 0440   GLUCOSE 141 (H) 06/21/2009 0440   BUN 11 06/21/2009 0440   CREATININE 0.77 06/21/2009 0440   CALCIUM 8.4 06/21/2009 0440   PROT 7.1 06/14/2009 1240   ALBUMIN 4.0 06/14/2009 1240   AST 20 06/14/2009 1240   ALT 23 06/14/2009 1240   ALKPHOS 85 06/14/2009 1240   BILITOT  0.3 06/14/2009 1240   GFRNONAA >60 06/21/2009 0440   GFRAA  06/21/2009 0440    >60        The eGFR has been calculated using the MDRD equation. This calculation has not been validated in all clinical situations. eGFR's persistently <60 mL/min signify possible Chronic Kidney Disease.   No results found for: CHOL, HDL, LDLCALC, LDLDIRECT, TRIG, CHOLHDL No results found for: HGBA1C No results found for: VITAMINB12 No results found for: TSH   11/25/16 A1c - 9.2  11/07/16 MRI cervical spine [I reviewed images myself and agree with interpretation. 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 - Incidental posterior pituitary cyst measuring 3 mm    ASSESSMENT AND PLAN  76 y.o. year old female here with gain balance difficulty for past 6-8 months, also found to have incidental pituitary cyst on MRI cervical spine imaging. This is likely an incidental finding. Patient's gait difficulty is most likely multifactorial related to obesity, degenerative lumbar and cervical spine disease, degenerative knee and hip disease, deconditioning, depression, diabetic neuropathy.   Dx:  1. 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 Essential hypertension      PLAN: - check MRI brain and pituitary protocol - continue PT exercises - refer to sleep studies consult (BMI 46, h/o OSA, last CPAP in 2015, last sleep study in 2004) - return to PCP re: BP and diabetes treatment  Orders Placed This Encounter  Procedures  . MR BRAIN W WO CONTRAST  . Ambulatory referral to Sleep Studies   Return in about 3 months  (around 03/04/2017).  I reviewed images, labs, notes, records myself. I summarized findings and reviewed with patient, for this high risk condition (gait diff, pituitary cyst, fall risk) requiring high complexity decision making.    Penni Bombard, MD 11/12/5359, 4:43 AM Certified in Neurology, Neurophysiology and Neuroimaging  [facility] [address] [place], [place] [address] [phone]

## 2016-12-28 ENCOUNTER — Ambulatory Visit
Admission: RE | Admit: 2016-12-28 | Discharge: 2016-12-28 | Disposition: A | Discharge status: Home / Self Care | Payer: Medicare Other | Source: Ambulatory Visit | Attending: Diagnostic Neuroimaging | Admitting: Diagnostic Neuroimaging

## 2016-12-28 DIAGNOSIS — I10 Essential (primary) hypertension: Secondary | ICD-10-CM

## 2016-12-28 DIAGNOSIS — E236 Other disorders of pituitary gland: Secondary | ICD-10-CM

## 2016-12-28 DIAGNOSIS — R269 Unspecified abnormalities of gait and mobility: Secondary | ICD-10-CM

## 2016-12-28 MED ORDER — GADOBENATE DIMEGLUMINE 529 MG/ML IV SOLN
10.0000 mL | Freq: Once | INTRAVENOUS | Status: AC | PRN
  Administered 2016-12-28: 10 mL via INTRAVENOUS

## 2017-01-03 ENCOUNTER — Telehealth: Payer: Self-pay | Admitting: *Deleted

## 2017-01-03 NOTE — Telephone Encounter (Signed)
Patient not at home. Spoke with husband, on [place] and informed him her mMI brain showed mMild spots of scarring which can be seen in patient's with hypertension,, diabetes, headaches.  Advised him that the small pituitary cyst is there and  known from before. Advised there are no other major findings. Reviewed Dr Gladstone Lighter recommendations for last office note with husband. He verbalized understanding, stated his wife would probably call back with questions. Gave him office number and advised the office closes on 12 noon on Fridays. He verbalized understanding.

## 2017-01-07 ENCOUNTER — Institutional Professional Consult (permissible substitution): Payer: TRICARE For Life (TFL) | Admitting: Neurology

## 2017-01-09 ENCOUNTER — Ambulatory Visit (INDEPENDENT_AMBULATORY_CARE_PROVIDER_SITE_OTHER): Payer: Medicare Other | Admitting: Neurology

## 2017-01-09 ENCOUNTER — Encounter: Payer: Self-pay | Admitting: Neurology

## 2017-01-09 VITALS — BP 146/96 | HR 90 | Ht 65.0 in | Wt 272.0 lb

## 2017-01-09 DIAGNOSIS — R351 Nocturia: Secondary | ICD-10-CM

## 2017-01-09 DIAGNOSIS — G478 Other sleep disorders: Secondary | ICD-10-CM | POA: Diagnosis not present

## 2017-01-09 DIAGNOSIS — R600 Localized edema: Secondary | ICD-10-CM | POA: Diagnosis not present

## 2017-01-09 DIAGNOSIS — G4733 Obstructive sleep apnea (adult) (pediatric): Secondary | ICD-10-CM

## 2017-01-09 DIAGNOSIS — Z6841 Body Mass Index (BMI) 40.0 and over, adult: Secondary | ICD-10-CM

## 2017-01-09 NOTE — Progress Notes (Signed)
Subjective:    Patient ID: [name] is a 76 y.o. female.  HPI     Star Age, MD, PhD [facility] [address], [address].O. [place], [place] [address]  Dear Bonnita Levan,   I saw your patient, [name], upon your kind request in my clinic today for initial consultation of her sleep disorder, in particular, concern for underlying obstructive sleep apnea. The patient is unaccompanied today. As you know, [name] is a 76 year old right-handed woman with an underlying medical history of breast cancer, arthritis, status post knee replacement surgeries, shoulder replacement surgery, depression, hypertension, diabetes, morbid obesity, and gait and balance issues with an incidental pituitary cyst found recently, who reports a prior diagnosis of OSA. She had a sleep study many years ago, I reviewed her study results from 06/02/2003, which showed a sleep efficiency of 73%, increased stage II sleep, no significant PLMS, total AHI around 6.1 per hour, O2 nadir 78% during REM sleep. She reports that she was placed on CPAP therapy, has stopped using it a few years ago, did not bring her CPAP machine. Her Epworth sleepiness score is 3 out of 24 today, her fatigue score is 25 out of 63. I reviewed your office note from 12/02/2016. She is married and lives with her husband. She has no children. She is retired, nonsmoker, does not currently consume any alcohol, denies illicit drug use, and drinks caffeine in the form of coffee, 2 cups in AM, and 3 small bottles of diet Cola.  She has gained weight over the years. Lost some in weight in the last year or so, gained most of it back. No family history of sleep apnea, some feet discomfort, not telltale for RLS.  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 Bedtime is between 10 and 11 generally speaking but she has difficulty falling asleep and staying asleep, has had sleep difficulties in that regard for years, since  she was in college she reports. She reports no family history of OSA. She has significant nocturia about 3-4 times on an average night. Wake up time is between 5 and 6 generally speaking. She tried CPAP years ago and found it cumbersome and aggravating. She may have had some improvement in her daytime energy when she was using CPAP.  Her Past Medical History Is Significant For: Past Medical History:  Diagnosis Date  . Cancer ([place])    breast, 2002  . Diabetes mellitus without complication ([place])   . Hypercholesteremia   . Hypertension     Her Past Surgical History Is Significant For: Past Surgical History:  Procedure Laterality Date  . ABDOMINAL HYSTERECTOMY  1976   total  . BREAST LUMPECTOMY Right 2002   chemo, radiation  . FOOT TENDON SURGERY Right 1990  . REPLACEMENT TOTAL KNEE     bilateral  . TOTAL SHOULDER REPLACEMENT Right     Her Family History Is Significant For: Family History  Problem Relation Age of Onset  . Stroke Mother   . Heart failure Father   . Cancer Sister        breast  . Cancer Brother   . Cancer Other        leukkemia, GYN cancer  . Stroke Brother   . Heart attack Brother     Her Social History Is Significant For: Social History   Social History  . Marital status: Married    Spouse name: Linsey  . Number of children: 0  .  Years of education: 42   Occupational History  .      retired   Social History Main Topics  . Smoking status: Never Smoker  . Smokeless tobacco: Never Used  . Alcohol use No  . Drug use: No  . Sexual activity: Not Asked   Other Topics Concern  . None   Social History Narrative   Lives with husband   Caffeine - coffee, 20-3 cups, Diet Coke- 3, 12 oz daily    Her Allergies Are:  Allergies  Allergen Reactions  . Metformin     Other reaction(s): Other (See Comments) dysegusia  :   Her Current Medications Are:  Outpatient Encounter Prescriptions as of 01/09/2017  Medication Sig  . escitalopram (LEXAPRO) 10  MG tablet Take 20 mg by mouth daily.  . furosemide (LASIX) 40 MG tablet Take 40 mg by mouth daily.  Marland Kitchen levothyroxine (SYNTHROID, LEVOTHROID) 150 MCG tablet 150 mcg daily.  . metFORMIN (GLUCOPHAGE) 500 MG tablet Take 1,000 mg by mouth 2 (two) times daily.  . prednisoLONE acetate (PRED FORTE) 1 % ophthalmic suspension INSTILL ONE DROP in affected eye (s) FOUR TIMES DAILY  . valsartan (DIOVAN) 160 MG tablet Take 320 mg by mouth daily. 12/02/16 has not taken yet today   No facility-administered encounter medications on file as of 01/09/2017.   :  Review of Systems:  Out of a complete 14 point review of systems, all are reviewed and negative with the exception of these symptoms as listed below: Review of Systems  Neurological:       Pt presents today to discuss her sleep. Pt has had a sleep study and has been on cpap before. Pt has a cpap at home but did not bring it today.  Epworth Sleepiness Scale 0= would never doze 1= slight chance of dozing 2= moderate chance of dozing 3= high chance of dozing  Sitting and reading: 1 Watching TV: 1 Sitting inactive in a public place (ex. Theater or meeting): 0 As a passenger in a car for an hour without a break: 0 Lying down to rest in the afternoon: 1 Sitting and talking to someone: 0 Sitting quietly after lunch (no alcohol): 0 In a car, while stopped in traffic: 0 Total: 3     Objective:  Neurologic Exam  Physical Exam Physical Examination:   Vitals:   01/09/17 0855  BP: (!) 146/96  Pulse: 90   General Examination: The patient is a very pleasant 76 y.o. female in no acute distress. She appears well-developed and well-nourished and adequately groomed.   HEENT: Normocephalic, atraumatic, pupils are unequal, left larger than R, round and reactive to light and accommodation. Funduscopic exam is normal with sharp disc margins noted. Extraocular tracking is good without limitation to gaze excursion or nystagmus noted. Normal smooth pursuit is  noted. Hearing is grossly intact. Tympanic membranes are clear bilaterally. Face is symmetric with normal facial animation and normal facial sensation. Speech is clear with no dysarthria noted. There is no hypophonia. There is no lip, neck/head, jaw or voice tremor. Neck is supple with full range of passive and active motion. There are no carotid bruits on auscultation. Oropharynx exam reveals: mild mouth dryness, marginal dental hygiene and moderate airway crowding, due to Redundant soft palate, Mallampati is class III. Tonsils are absent.  Neck circumference is 16-1/2 inches.  Chest: Clear to auscultation without wheezing, rhonchi or crackles noted.  Heart: S1+S2+0, regular and normal without murmurs, rubs or gallops noted.   Abdomen: Soft,  non-tender and non-distended with normal bowel sounds appreciated on auscultation.  Extremities: There is 1+ pitting edema in the distal lower extremities bilaterally. Pedal pulses are intact.  Skin: Warm and dry without trophic changes noted.  Musculoskeletal: exam reveals no obvious joint deformities, tenderness or joint swelling or erythema.   Neurologically:  Mental status: The patient is awake, alert and oriented in all 4 spheres. Her immediate and remote memory, attention, language skills and fund of knowledge are mildly impaired. She seems to have word finding difficulties. Speech is clear with normal prosody and enunciation. Thought process is linear. Mood is normal and affect is blunted.  Cranial nerves II - XII are as described above under HEENT exam. In addition: shoulder shrug is normal with equal shoulder height noted. Motor exam: Normal bulk, strength and tone is noted. There is no drift, tremor or rebound. Romberg is not tested for safety. Fine motor skills and coordination:  globally mildly impaired.  Cerebellar testing: No dysmetria or intention tremor.  Sensory exam: intact to light touch.  Gait, station and balance: She stands with  difficulty. Her upper body is tilted to the left, she walks cautiously and slightly wide-based. She walks with a limp, slight waddle.                Assessment and Plan:  In summary, [name] is a very pleasant 76 y.o.-year old female with an underlying medical history of breast cancer, arthritis, status post knee replacement surgeries, shoulder replacement surgery, depression, hypertension, diabetes, morbid obesity, and gait and balance issues with an incidental pituitary cyst found recently, who was previously diagnosed with obstructive sleep apnea. She would be willing to get reevaluated for it and consider CPAP therapy.  I had a long chat with the patient about my findings and the diagnosis of OSA, its prognosis and treatment options. We talked about medical treatments, surgical interventions and non-pharmacological approaches. I explained in particular the risks and ramifications of untreated moderate to severe OSA, especially with respect to developing cardiovascular disease down the Road, including congestive heart failure, difficult to treat hypertension, cardiac arrhythmias, or stroke. Even type 2 diabetes has, in part, been linked to untreated OSA. Symptoms of untreated OSA include daytime sleepiness, memory problems, mood irritability and mood disorder such as depression and anxiety, lack of energy, as well as recurrent headaches, especially morning headaches. We talked about trying to maintain a healthy lifestyle in general, as well as the importance of weight control. I encouraged the patient to eat healthy, exercise daily and keep well hydrated, to keep a scheduled bedtime and wake time routine, to not skip any meals and eat healthy snacks in between meals. I advised the patient not to drive when feeling sleepy. I recommended the following at this time: sleep study with potential positive airway pressure titration. (We will score hypopneas at 4%).   I explained the sleep test procedure  to the patient and also outlined possible surgical and non-surgical treatment options of OSA, including the use of a custom-made dental device (which would require a referral to a specialist dentist or oral surgeon), upper airway surgical options, such as pillar implants, radiofrequency surgery, tongue base surgery, and UPPP (which would involve a referral to an ENT surgeon). Rarely, jaw surgery such as mandibular advancement may be considered.  I also explained the CPAP treatment option to the patient, who indicated that she would be willing to try CPAP if the need arises. I explained the importance of being compliant with  PAP treatment, not only for insurance purposes but primarily to improve Her symptoms, and for the patient's long term health benefit, including to reduce Her cardiovascular risks. I answered all her questions today and the patient was in agreement. I will likely see her back after the sleep study is completed and encouraged her to call with any interim questions, concerns, problems or updates.   Thank you very much for allowing me to participate in the care of this nice patient. If I can be of any further assistance to you please do not talk to me.   Sincerely,   Star Age, MD, PhD

## 2017-01-09 NOTE — Patient Instructions (Signed)

## 2017-01-21 ENCOUNTER — Ambulatory Visit (INDEPENDENT_AMBULATORY_CARE_PROVIDER_SITE_OTHER): Payer: Medicare Other | Admitting: Neurology

## 2017-01-21 DIAGNOSIS — G4733 Obstructive sleep apnea (adult) (pediatric): Secondary | ICD-10-CM | POA: Diagnosis not present

## 2017-01-28 ENCOUNTER — Telehealth: Payer: Self-pay

## 2017-01-28 NOTE — Procedures (Signed)
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 She stopped using CPAP some years ago. Her Epworth sleepiness score is 3 out of 24 today, her fatigue score is 25 out of 63. The patient's weight 272 pounds with a height of 65 (inches), resulting in a BMI of 45.2 kg/m2. The patient's neck circumference measured 16.5 inches.  CURRENT MEDICATIONS: Lexapro, Lasix, Synthroid, Glucophage, Pred Forte, Diovan   PROCEDURE:  This is a multichannel digital polysomnogram utilizing the Somnostar 11.2 system.  Electrodes and sensors were applied and monitored per AASM Specifications.   EEG, EOG, Chin and Limb EMG, were sampled at 200 Hz.  ECG, Snore and Nasal Pressure, Thermal Airflow, Respiratory Effort, CPAP Flow and Pressure, Oximetry was sampled at 50 Hz. Digital video and audio were recorded.      BASELINE STUDY  Lights Out was at 22:05 and Lights On at 05:05.  Total recording time (TRT) was 420.5 minutes, with a total sleep time (TST) of  399 minutes.   The patient's sleep latency was 16.5 minutes, which is normal. REM latency was 184.5 minutes, which is delayed. The sleep efficiency was 94.9 %.     SLEEP ARCHITECTURE: WASO (Wake after sleep onset) was 2.5 minutes.  There were 3 minutes in Stage N1, 287 minutes Stage N2, 53.5 minutes Stage N3 and 55.5 minutes in Stage REM.  The percentage of Stage N1 was .8%, Stage N2 was 71.9%, which is increased, Stage N3 was 13.4%, and Stage R (REM sleep) was 13.9%, which is  reduced. The arousals were noted as: 80 were spontaneous, 0 were associated with PLMs, 55 were associated with respiratory events.    Audio and video analysis did not show any abnormal or unusual movements, behaviors, phonations or vocalizations.  The patient took 1 bathroom break. Snoring was noted, ranging from mild to loud.  EKG was in keeping with normal sinus rhythm (NSR).  RESPIRATORY ANALYSIS:  There were a total of 55 respiratory events:  3 obstructive apneas, 0 central apneas and 0 mixed apneas with a total of 3 apneas and an apnea index (AI) of .5 /hour. There were 52 hypopneas with a hypopnea index of 7.8 /hour. The patient also had 0 respiratory event related arousals (RERAs).      The total APNEA/HYPOPNEA INDEX (AHI) was 8.3/hour and the total RESPIRATORY DISTURBANCE INDEX was 8.3 /hour.  49 events occurred in REM sleep and 12 events in NREM. The REM AHI was 53. /hour, versus a non-REM AHI of 1.. The patient slept exclusively in the right lateral position, supine AHI was n/a versus a non-supine AHI of 8.3.  OXYGEN SATURATION & C02:  The Wake baseline 02 saturation was 92%, with the lowest being 82%. Time spent below 89% saturation equaled 107 minutes.  PERIODIC LIMB MOVEMENTS:  The patient had a total of 0 Periodic Limb Movements.  The Periodic Limb Movement (PLM) index was 0 and the PLM Arousal index was 0/hour.  Post-study, the patient indicated that sleep was the same as  usual.   IMPRESSION:  1. Obstructive Sleep Apnea (OSA)  RECOMMENDATIONS:  1. This study demonstrates overall mild obstructive sleep apnea, severe in REM sleep with a total AHI of 8.3/hour, REM AHI of 53/hour, and O2 nadir of 83%. Given the patient's medical history and sleep related complaints, as well as desaturations into the 80s repeatedly, a full-night CPAP titration study is recommended to optimize therapy. Other treatment options may include avoidance of supine sleep position along with weight loss, upper  airway or jaw surgery in selected patients or the use of an oral appliance in certain patients. ENT evaluation and/or consultation with a maxillofacial surgeon or dentist may be feasible in some instances. Weight loss is strongly recommended.   2. Please note that untreated obstructive sleep apnea carries additional perioperative morbidity. Patients with significant obstructive sleep apnea should receive perioperative PAP therapy and the surgeons and particularly the anesthesiologist should be informed of the diagnosis and the severity of the sleep disordered breathing. 3. The patient should be cautioned not to drive, work at heights, or operate dangerous or heavy equipment when tired or sleepy. Review and reiteration of good sleep hygiene measures should be pursued with any patient. 4. The patient will be seen in follow-up by Dr. Rexene Alberts at [facility] for discussion of the test results and further management strategies. The referring provider will be notified of the test results.  I certify that I have reviewed the entire raw data recording prior to the issuance of this report in accordance with the Standards of Accreditation of the American Academy of Sleep Medicine (AASM)   Star Age, MD, PhD Diplomat, American Board of Psychiatry and Neurology (Neurology and Sleep Medicine)

## 2017-01-28 NOTE — Progress Notes (Signed)
Patient referred by Dr. Leta Baptist, seen by me on 01/09/17, diagnostic PSG on 01/21/17.    Please call and notify the patient that the recent sleep study did confirm the diagnosis of mild obstructive sleep apnea, severe in REM sleep with a total AHI of 8.3/hour, REM AHI of 53/hour, and O2 nadir of 82%. Given the patient's medical history and sleep related complaints, as well as desaturations into the 80s repeatedly, I recommend treatment for this in the form of CPAP. This will require a repeat sleep study for proper titration and mask fitting. Please explain to patient and arrange for a CPAP titration study. I have placed an order in the chart. Thanks, and please route to [facility] for scheduling next sleep study.  Star Age, MD, PhD [facility])

## 2017-01-28 NOTE — Telephone Encounter (Signed)
-----   Message from Star Age, MD sent at 01/28/2017  8:21 AM EDT ----- Patient referred by Dr. Leta Baptist, seen by me on 01/09/17, diagnostic PSG on 01/21/17.    Please call and notify the patient that the recent sleep study did confirm the diagnosis of mild obstructive sleep apnea, severe in REM sleep with a total AHI of 8.3/hour, REM AHI of 53/hour, and O2 nadir of 82%. Given the patient's medical history and sleep related complaints, as well as desaturations into the 80s repeatedly, I recommend treatment for this in the form of CPAP. This will require a repeat sleep study for proper titration and mask fitting. Please explain to patient and arrange for a CPAP titration study. I have placed an order in the chart. Thanks, and please route to [facility] for scheduling next sleep study.  Star Age, MD, PhD [facility])

## 2017-01-28 NOTE — Telephone Encounter (Signed)
I called pt. I advised pt that Dr. Rexene Alberts reviewed their sleep study results and found that pt has mild osa overall, but severe osa in her REM sleep, and with oxygen desaturations. Dr. Rexene Alberts recommends treatment in the form of a cpap given pt's medical history and sleep related complaints. Dr. Rexene Alberts recommends that pt return for a repeat sleep study in order to properly titrate the cpap and ensure a good mask fit. Pt is agreeable to returning for a titration study. I advised pt that our sleep lab will file with pt's insurance and call pt to schedule the sleep study when we hear back from the pt's insurance regarding coverage of this sleep study. Pt reports having cataract surgeries coming up but will schedule her cpap titration study around these surgeries when our sleep lab calls her. Pt verbalized understanding of results. Pt had no questions at this time but was encouraged to call back if questions arise.

## 2017-01-28 NOTE — Addendum Note (Signed)
Addended by: Star Age on: 01/28/2017 08:21 AM   Modules accepted: Orders

## 2017-01-29 IMAGING — CT CT 3D INDEPENDENT WKST
3 series · 8 of 14 positions shown, 9 images · non-contrast
Comparison: MRI dated 01/23/2006

CLINICAL DATA: Left shoulder pain with limited range of motion.
Severe osteoarthritis of the glenohumeral joint. Previous rotator
cuff repair.

EXAM:
CT OF THE LEFT SHOULDER WITHOUT CONTRAST; 3-DIMENSIONAL CT IMAGE
RENDERING ON INDEPENDENT WORKSTATION
TECHNIQUE: Multidetector CT imaging was performed according to the standard
protocol. Multiplanar CT image reconstructions were also generated.
Three-dimensional CT images were created on an independent
workstation.

[Series 5: shoulder soft · axial · 0.59mm/px · z∈[-175,-100]mm · 2 of 91 slices shown, 3 images]
[im 31/91  soft-tissue]
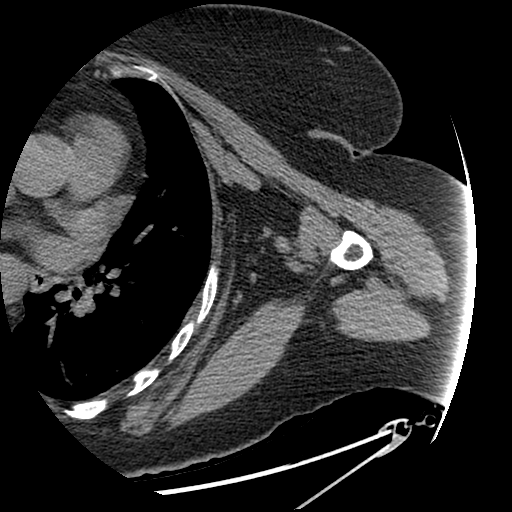
[im 31/91  bone]
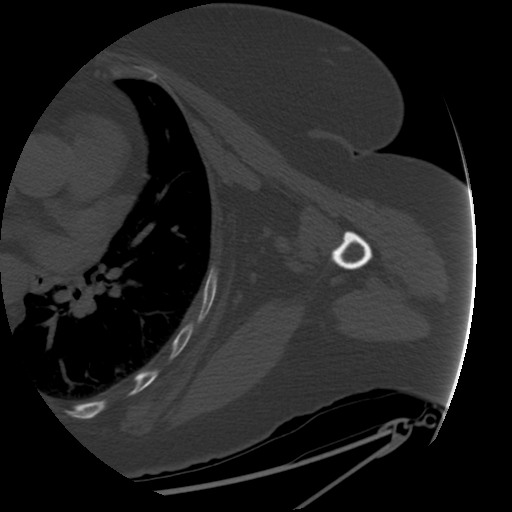
[im 61/91  bone]
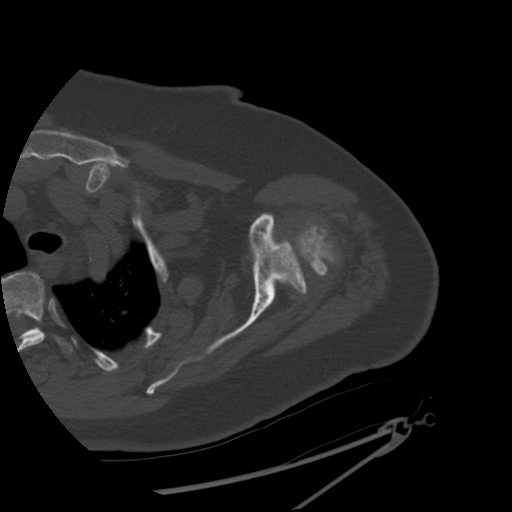

[Series 300: sag soft · oblique · 0.59mm/px · 3 of 124 slices shown]
[im 31/124  soft-tissue]
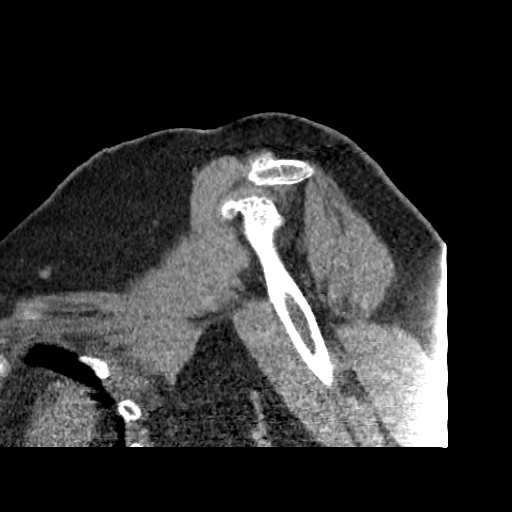
[im 62/124  soft-tissue]
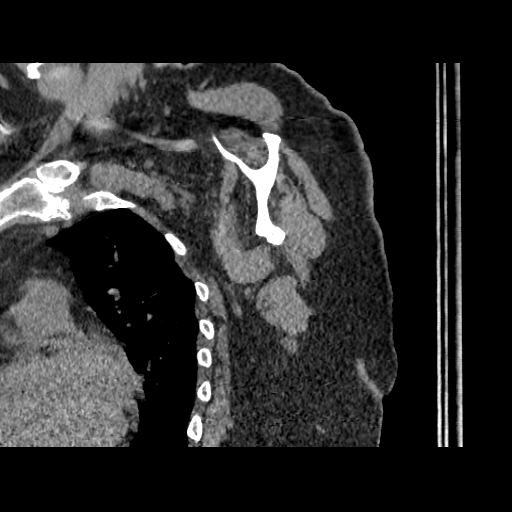
[im 93/124  soft-tissue]
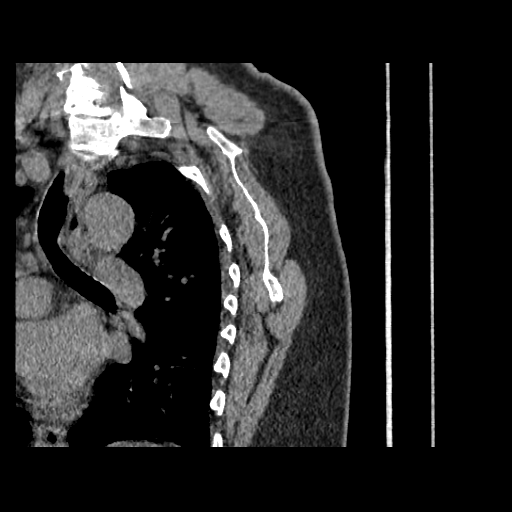

[Series 302: cor soft · oblique · 0.59mm/px · 3 of 120 slices shown]
[im 30/120  soft-tissue]
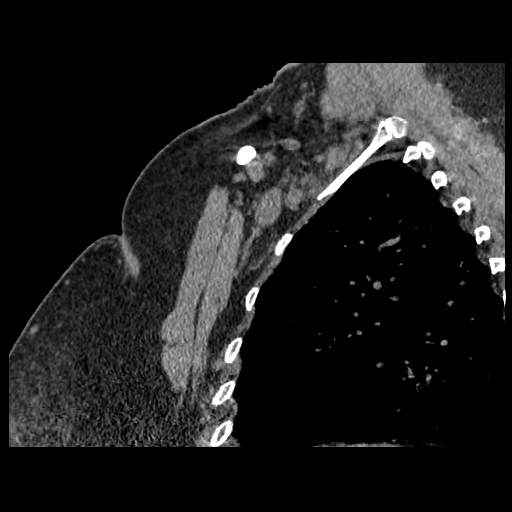
[im 60/120  soft-tissue]
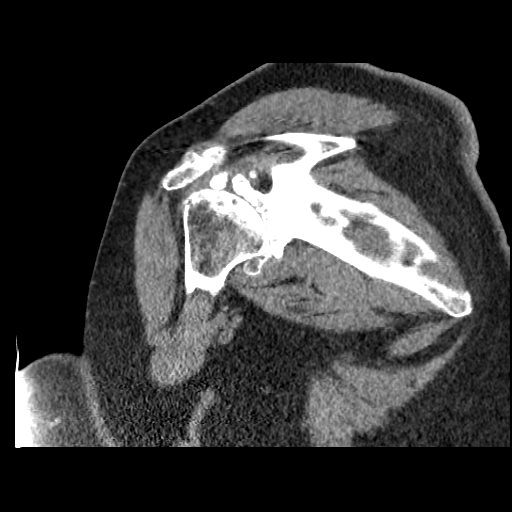
[im 90/120  soft-tissue]
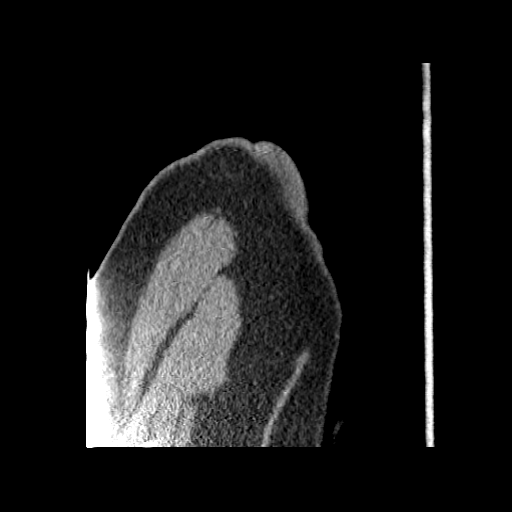

[8 of 14 positions shown; findings below may reference images not displayed]

FINDINGS: There is severe osteoarthritis of the glenohumeral joint with
numerous erosions and subcortical cysts with diffuse full-thickness
cartilage loss on the humeral head and glenoid. Prominent marginal
osteophytes on the humeral head and glenoid. Calcifications within
the supraspinous and infraspinatus tendons. Multiple loose bodies in
the joint.

The osteophytes on the humeral head should restrict external
rotation and adduction. There is slight atrophy of the
supraspinatus, infraspinatus, and subscapularis muscles.

Slight type 3 acromion.  Minimal hypertrophy at the AC joint.

Three-dimensional images better demonstrate the relationship of the
loose bodies, tendon calcifications and prominent osteophytes to one
another.
IMPRESSION: 1. Severe glenohumeral joint arthritis.
2. Calcific tendinopathy of the rotator cuff.
3. Multiple loose bodies in the joint.

## 2017-02-10 ENCOUNTER — Ambulatory Visit (INDEPENDENT_AMBULATORY_CARE_PROVIDER_SITE_OTHER): Payer: Medicare Other | Admitting: Neurology

## 2017-02-10 DIAGNOSIS — G4733 Obstructive sleep apnea (adult) (pediatric): Secondary | ICD-10-CM

## 2017-02-11 ENCOUNTER — Telehealth: Payer: Self-pay

## 2017-02-11 NOTE — Telephone Encounter (Signed)
I called pt. I advised pt that Dr. Rexene Alberts reviewed their sleep study results and found that pt did well during the latest sleep study with CPAP. Dr. Rexene Alberts recommends that pt start a cpap at home. I reviewed PAP compliance expectations with the pt. Pt is agreeable to starting a CPAP. I advised pt that an order will be sent to a DME, [facility], and [employer] will call the pt within about one week after they file with the pt's insurance. [facility] will show the pt how to use the machine, fit for masks, and troubleshoot the CPAP if needed. A follow up appt was made for insurance purposes with Dr. Rexene Alberts on Monday, October 1st, 2018 at 9:30am . Pt verbalized understanding to arrive 15 minutes early and bring their CPAP. A letter with all of this information in it will be mailed to the pt as a reminder. I verified with the pt that the address we have on file is correct. Pt verbalized understanding of results. Pt had no questions at this time but was encouraged to call back if questions arise.

## 2017-02-11 NOTE — Telephone Encounter (Signed)
-----   Message from Star Age, MD sent at 02/11/2017  8:25 AM EDT ----- Patient referred by Dr. Leta Baptist, seen by me on 01/09/17, diagnostic PSG on 01/21/17, CPAP study on 02/10/17.  Please call and inform patient that I have entered an order for treatment with positive airway pressure (PAP) treatment of obstructive sleep apnea (OSA). She did well during the latest sleep study with CPAP. We will, therefore, arrange for a machine for home use through a DME (durable medical equipment) company of Her choice; and I will see the patient back in follow-up in about 10 weeks; may be schedule with MM or CM if necessary. Please also explain to the patient that I will be looking out for compliance data, which can be downloaded from the machine (stored on an SD card, that is inserted in the machine) or via remote access through a modem, that is built into the machine. At the time of the followup appointment we will discuss sleep study results and how it is going with PAP treatment at home. Please advise patient to bring Her machine at the time of the first FU visit, even though this is cumbersome. Bringing the machine for every visit after that will likely not be needed, but often helps for the first visit to troubleshoot if needed. Please re-enforce the importance of compliance with treatment and the need for [place] to monitor compliance data - often an insurance requirement and actually good feedback for the patient as far as how they are doing.  Also remind patient, that any interim PAP machine or mask issues should be first addressed with the DME company, as they can often help better with technical and mask fit issues. Please ask if patient has a preference regarding DME company.  Please also make sure, the patient has a follow-up appointment with me or one of our NPs in about 10 weeks from the setup date, thanks.  Once you have spoken to the patient - and faxed/routed report to PCP and referring MD (if other than PCP),  you can close this encounter, thanks,   Star Age, MD, PhD [facility] ([place])

## 2017-02-11 NOTE — Addendum Note (Signed)
Addended by: Star Age on: 02/11/2017 08:25 AM   Modules accepted: Orders

## 2017-02-11 NOTE — Progress Notes (Signed)
Patient referred by Dr. Leta Baptist, seen by me on 01/09/17, diagnostic PSG on 01/21/17, CPAP study on 02/10/17.  Please call and inform patient that I have entered an order for treatment with positive airway pressure (PAP) treatment of obstructive sleep apnea (OSA). She did well during the latest sleep study with CPAP. We will, therefore, arrange for a machine for home use through a DME (durable medical equipment) company of Her choice; and I will see the patient back in follow-up in about 10 weeks; may be schedule with MM or CM if necessary. Please also explain to the patient that I will be looking out for compliance data, which can be downloaded from the machine (stored on an SD card, that is inserted in the machine) or via remote access through a modem, that is built into the machine. At the time of the followup appointment we will discuss sleep study results and how it is going with PAP treatment at home. Please advise patient to bring Her machine at the time of the first FU visit, even though this is cumbersome. Bringing the machine for every visit after that will likely not be needed, but often helps for the first visit to troubleshoot if needed. Please re-enforce the importance of compliance with treatment and the need for [place] to monitor compliance data - often an insurance requirement and actually good feedback for the patient as far as how they are doing.  Also remind patient, that any interim PAP machine or mask issues should be first addressed with the DME company, as they can often help better with technical and mask fit issues. Please ask if patient has a preference regarding DME company.  Please also make sure, the patient has a follow-up appointment with me or one of our NPs in about 10 weeks from the setup date, thanks.  Once you have spoken to the patient - and faxed/routed report to PCP and referring MD (if other than PCP), you can close this encounter, thanks,   Star Age, MD, PhD [facility] ([place])

## 2017-02-11 NOTE — Procedures (Signed)
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 She had a baseline sleep study on 01/21/17, which showed overall mild obstructive sleep apnea, severe in REM sleep with a total AHI of 8.3/hour, REM AHI of 53/hour, and O2 nadir of 83%.  CURRENT MEDICATIONS: Lexapro, Lasix, Synthroid, Glucophage, Pred Forte, Diovan  PROCEDURE:  This is a multichannel digital polysomnogram utilizing the SomnoStar 11.2 system.  Electrodes and sensors were applied and monitored per AASM Specifications.   EEG, EOG, Chin and Limb EMG, were sampled at 200 Hz.  ECG, Snore and Nasal Pressure, Thermal Airflow, Respiratory Effort, CPAP Flow and Pressure, Oximetry was sampled at 50 Hz. Digital video and audio were recorded.      The patient was fitted with medium nasal pillows. CPAP was initiated at 5 cmH20 with heated humidity per AASM standards and pressure was advanced to 6 cmH20 because of hypopneas, apneas and desaturations.  At a PAP pressure of 6 cmH20, there was a reduction of the AHI to 0/hour, with non-supine REM sleep achieved and O2 nadir of 88%.    Lights Out was at 21:12 and Lights On at 05:08. Total recording time (TRT) was 476 minutes, with a total sleep time (TST) of 443 minutes. The patient's sleep latency was 24 minutes. REM latency was 119 minutes, which is high normal. The sleep efficiency was 93.1 %.    SLEEP ARCHITECTURE: WASO (Wake after sleep onset) was 9 minutes.  There were 4 minutes in Stage N1, 209 minutes Stage N2,  115 minutes Stage N3 and 115 minutes in Stage REM. The percentage of Stage N1 was .9%, Stage N2 was 47.2%, which is normal, Stage N3 was 26.%, which is increased, and Stage R (REM sleep) was 26.%, which is mildly increased. The arousals were noted as: 45 were spontaneous, 0 were associated with PLMs, 0 were associated with respiratory events.  Audio and video analysis did not show any abnormal or unusual movements, behaviors, phonations or vocalizations.  The patient took no bathroom breaks. The EKG was in keeping with normal sinus rhythm (NSR).  RESPIRATORY ANALYSIS: There was a total of 0 respiratory events: 0 obstructive apneas, 0 central apneas and 0 mixed apneas with a total of 0 apneas and an apnea index (AI) of 0 /hour. There were 0 hypopneas with a hypopnea index of 0/hour. The patient also had 0 respiratory event related arousals (RERAs).      The total APNEA/HYPOPNEA INDEX  (AHI) was 0 /hour and the total RESPIRATORY DISTURBANCE INDEX was 0 .hour  0 events occurred in REM sleep and 0 events in NREM. The REM AHI was 0 /hour versus a non-REM AHI of 0 /hour.  The patient spent 0 minutes of total sleep time in the supine position and 443 minutes in non-supine. The supine AHI was 0.0, versus a non-supine AHI of 0.0.  OXYGEN SATURATION & C02:  The baseline 02 saturation was 93%, with the lowest being 88%. Time spent below 89% saturation  equaled 0 minutes.  PERIODIC LIMB MOVEMENTS: The patient had a total of 0 Periodic Limb Movements. The Periodic Limb Movement (PLM) index was 0 and the PLM Arousal index was 0 /hour.  Post-study, the patient indicated that sleep was better than usual.   DIAGNOSIS 1. Obstructive Sleep Apnea (OSA)   PLANS/RECOMMENDATIONS: 1. This study demonstrates resolution of the patient's obstructive sleep apnea with CPAP therapy. Due to her O2 nadir of 88% and non-supine REM sleep achieved on the final pressure, I will recommend a home CPAP treatment pressure of 7 cm via  medium nasal pillows with heated humidity. The patient should be reminded to be fully compliant with PAP therapy to improve sleep related symptoms and decrease long term cardiovascular risks. The patient should be reminded, that it may take up to 3 months to get fully used to using PAP with all planned sleep. The earlier full compliance is achieved, the better long term compliance tends to be. Please note that untreated obstructive sleep apnea carries additional perioperative morbidity. Patients with significant obstructive sleep apnea should receive perioperative PAP therapy and the surgeons and particularly the anesthesiologist should be informed of the diagnosis and the severity of the sleep disordered breathing. 2. The patient should be cautioned not to drive, work at heights, or operate dangerous or heavy equipment when tired or sleepy. Review and reiteration of good sleep hygiene measures should be pursued with any patient. 3. The patient will be seen in follow-up by Dr. Rexene Alberts at [facility] for discussion of the test results and further management strategies. The referring provider will be notified of the test results.  I certify that I have reviewed the entire raw data recording prior to the issuance of this report in accordance with the Standards of Accreditation of the American Academy of Sleep Medicine (AASM)    Star Age, MD, PhD Diplomat, American Board of Psychiatry and Neurology (Neurology and Sleep Medicine)

## 2017-03-13 ENCOUNTER — Ambulatory Visit: Payer: Medicare Other | Admitting: Cardiology

## 2017-03-17 ENCOUNTER — Encounter: Payer: Self-pay | Admitting: Diagnostic Neuroimaging

## 2017-03-17 ENCOUNTER — Ambulatory Visit (INDEPENDENT_AMBULATORY_CARE_PROVIDER_SITE_OTHER): Payer: Medicare Other | Admitting: Diagnostic Neuroimaging

## 2017-03-17 VITALS — BP 153/97 | HR 106 | Wt 278.4 lb

## 2017-03-17 DIAGNOSIS — I10 Essential (primary) hypertension: Secondary | ICD-10-CM | POA: Diagnosis not present

## 2017-03-17 DIAGNOSIS — E236 Other disorders of pituitary gland: Secondary | ICD-10-CM

## 2017-03-17 DIAGNOSIS — G4733 Obstructive sleep apnea (adult) (pediatric): Secondary | ICD-10-CM

## 2017-03-17 DIAGNOSIS — Z6841 Body Mass Index (BMI) 40.0 and over, adult: Secondary | ICD-10-CM

## 2017-03-17 DIAGNOSIS — R269 Unspecified abnormalities of gait and mobility: Secondary | ICD-10-CM

## 2017-03-17 NOTE — Progress Notes (Signed)
[facility]  PATIENT: [name] DOB: Jan 06, 1941  REFERRING CLINICIAN: Beane, J HISTORY FROM: patient REASON FOR VISIT: follow up     HISTORICAL  CHIEF COMPLAINT:  Chief Complaint  Patient presents with  . Gait Problem    rm 6, "walking seems to be the same, was better following cataract surgery for a while; usually when I am turning"  . Follow-up    3 month    HISTORY OF PRESENT ILLNESS:   UPDATE (03/17/17, VRP): Since last visit, doing better with balance. No falls. Tolerating CPAP now.   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 For past 6-8 months patient has had intermittent balance and gait difficulty. She has difficulty turning around, tends to fall down, sometimes uses a cane, sometimes has dizziness. Patient also has significant neck pain and headache. Patient went to orthopedic surgeon who ordered MRI cervical spine showing multilevel degenerative changes. Also incidental small posterior pituitary cyst was noted. Therefore patient referred to me for further evaluation. Patient has history of sleep apnea, sleep study 2004 and AHI of 6. She was started on CPAP at that time. She was following with Dr. Gwenette Greet, last seen a 2009. She stopped using CPAP in 2015. Patient also has history of hypertension, diabetes, depression, breast cancer, bilateral knee replacement, shoulder replacement, insomnia, depression.  REVIEW OF SYSTEMS: Full 14 system review of systems performed and negative with exception of: fatigue incontinence neck pain leg swelling fatigue.    ALLERGIES: Allergies  Allergen Reactions  . Metformin     Other reaction(s): Other (See Comments) dysegusia    HOME MEDICATIONS: Outpatient Medications Prior to Visit  Medication Sig Dispense Refill  . escitalopram (LEXAPRO) 10 MG tablet Take 20 mg by mouth daily.    . furosemide (LASIX) 40 MG tablet Take 40 mg by mouth daily.    Marland Kitchen  levothyroxine (SYNTHROID, LEVOTHROID) 150 MCG tablet 150 mcg daily.    . metFORMIN (GLUCOPHAGE) 500 MG tablet Take 1,000 mg by mouth 2 (two) times daily.    . prednisoLONE acetate (PRED FORTE) 1 % ophthalmic suspension INSTILL ONE DROP in affected eye (s) FOUR TIMES DAILY    . valsartan (DIOVAN) 160 MG tablet Take 320 mg by mouth daily. 12/02/16 has not taken yet today     No facility-administered medications prior to visit.     PAST MEDICAL HISTORY: Past Medical History:  Diagnosis Date  . Cancer ([place])    breast, 2002  . Diabetes mellitus without complication ([place])   . Hypercholesteremia   . Hypertension     PAST SURGICAL HISTORY: Past Surgical History:  Procedure Laterality Date  . ABDOMINAL HYSTERECTOMY  1976   total  . BREAST LUMPECTOMY Right 2002   chemo, radiation  . CATARACT EXTRACTION, BILATERAL  2018  . FOOT TENDON SURGERY Right 1990  . REPLACEMENT TOTAL KNEE     bilateral  . TOTAL SHOULDER REPLACEMENT Right     FAMILY HISTORY: Family History  Problem Relation Age of Onset  . Stroke Mother   . Heart failure Father   . Cancer Sister        breast  . Memory loss Sister   . Cancer Brother   . Cancer Other        leukkemia, GYN cancer  . Stroke Brother   . Heart attack Brother     SOCIAL HISTORY:  Social History   Social History  . Marital status: Married    Spouse name:  Linsey  . Number of children: 0  . Years of education: 48   Occupational History  .      retired   Social History Main Topics  . Smoking status: Never Smoker  . Smokeless tobacco: Never Used  . Alcohol use No  . Drug use: No  . Sexual activity: Not on file   Other Topics Concern  . Not on file   Social History Narrative   Lives with husband   Caffeine - coffee, 20-3 cups, Diet Coke- 3, 12 oz daily     PHYSICAL EXAM  GENERAL EXAM/CONSTITUTIONAL: Vitals:  Vitals:   03/17/17 0917  BP: (!) 153/97  Pulse: (!) 106  Weight: 278 lb 6.4 oz (126.3 kg)   Body mass index  is 46.33 kg/m. No exam data present  Patient is in no distress; well developed, nourished and groomed; neck is supple  CARDIOVASCULAR:  Examination of carotid arteries is normal; no carotid bruits  Regular rate and rhythm, no murmurs  Examination of peripheral vascular system by observation and palpation is normal  EYES:  Ophthalmoscopic exam of optic discs and posterior segments is normal; no papilledema or hemorrhages  MUSCULOSKELETAL:  Gait, strength, tone, movements noted in Neurologic exam below  NEUROLOGIC: MENTAL STATUS:  No flowsheet data found.  awake, alert, oriented to person, place and time  recent and remote memory intact  normal attention and concentration  language fluent, comprehension intact, naming intact,   fund of knowledge appropriate  CRANIAL NERVE:   2nd - no papilledema on fundoscopic exam  2nd, 3rd, 4th, 6th - pupils equal and reactive to light, visual fields full to confrontation, extraocular muscles intact, no nystagmus  5th - facial sensation symmetric  7th - facial strength symmetric  8th - hearing intact  9th - palate elevates symmetrically, uvula midline  11th - shoulder shrug symmetric  12th - tongue protrusion midline  MOTOR:   normal bulk and tone, full strength in the BUE  SLIGHTLY LIMITED IN RUE DUE TO SHOULDER PAIN  SENSORY:   normal and symmetric to light touch, temperature, vibration  DECR PP IN VIB IN FEET  COORDINATION:   finger-nose-finger, fine finger movements normal  REFLEXES:   deep tendon reflexes TRACE and symmetric  ABSENT AT KNEES AND ANKLES  GAIT/STATION:   narrow based gait; SLOW AND UNSTEADY    DIAGNOSTIC DATA (LABS, IMAGING, TESTING) - I reviewed patient records, labs, notes, testing and imaging myself where available.  Lab Results  Component Value Date   WBC 7.8 06/21/2009   HGB 11.3 (L) 06/21/2009   HCT 33.4 (L) 06/21/2009   MCV 93.2 06/21/2009   PLT 186 06/21/2009        Component Value Date/Time   NA 140 06/21/2009 0440   K 3.5 06/21/2009 0440   CL 102 06/21/2009 0440   CO2 32 06/21/2009 0440   GLUCOSE 141 (H) 06/21/2009 0440   BUN 11 06/21/2009 0440   CREATININE 0.77 06/21/2009 0440   CALCIUM 8.4 06/21/2009 0440   PROT 7.1 06/14/2009 1240   ALBUMIN 4.0 06/14/2009 1240   AST 20 06/14/2009 1240   ALT 23 06/14/2009 1240   ALKPHOS 85 06/14/2009 1240   BILITOT 0.3 06/14/2009 1240   GFRNONAA >60 06/21/2009 0440   GFRAA  06/21/2009 0440    >60        The eGFR has been calculated using the MDRD equation. This calculation has not been validated in all clinical situations. eGFR's persistently <60 mL/min signify possible Chronic  Kidney Disease.   No results found for: CHOL, HDL, LDLCALC, LDLDIRECT, TRIG, CHOLHDL No results found for: HGBA1C No results found for: VITAMINB12 No results found for: TSH   11/25/16 A1c - 9.2  11/07/16 MRI cervical spine [I reviewed images myself and agree with interpretation. 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 - Incidental posterior pituitary cyst measuring 3 mm  12/28/16 MRI brain (with and without)  1. Scattered T2/FLAIR hyperintense foci in the hemispheres and pons consistent with mild chronic microvascular ischemic change. There is no atrophy. 2. 3-4 mm non enhancing benign-appearing cyst in the left posterior pituitary gland. 3. There are no acute findings. The brain has a normal enhancement pattern.     ASSESSMENT AND PLAN  76 y.o. year old female here with gain balance difficulty for past 6-8 months, also found  to have incidental pituitary cyst on MRI cervical spine imaging. This is likely an incidental finding. Patient's gait difficulty is most likely multifactorial related to obesity, degenerative lumbar and cervical spine disease, degenerative knee and hip disease, deconditioning, depression, diabetic neuropathy.   Dx:  1. Pituitary cyst (HCC)   2. Gait difficulty   3. BMI 45.0-49.9, adult ([place])   4. Essential hypertension   5. OSA (obstructive sleep apnea)      PLAN:  I spent 15 minutes of face to face time with patient. Greater than 50% of time was spent in counseling and coordination of care with patient. In summary we discussed:   - patient found to have incidental pituitary cyst (likely benign); no further follow at this time - continue PT exercises; stay active; caution with fall risk - continue CPAP for OSA - continue BP and diabetes treatment per PCP  Return if symptoms worsen or fail to improve, for return to PCP.    Penni Bombard, MD 03/14/2352, 6:14 AM Certified in Neurology, Neurophysiology and Neuroimaging  [facility] [address] [place], [place] [address] [phone]

## 2017-03-19 ENCOUNTER — Ambulatory Visit (INDEPENDENT_AMBULATORY_CARE_PROVIDER_SITE_OTHER): Payer: Medicare Other | Admitting: Cardiology

## 2017-03-19 ENCOUNTER — Encounter: Payer: Self-pay | Admitting: Cardiology

## 2017-03-19 VITALS — BP 140/90 | HR 70 | Ht 66.0 in | Wt 275.8 lb

## 2017-03-19 DIAGNOSIS — Z6841 Body Mass Index (BMI) 40.0 and over, adult: Secondary | ICD-10-CM

## 2017-03-19 DIAGNOSIS — R0789 Other chest pain: Secondary | ICD-10-CM | POA: Insufficient documentation

## 2017-03-19 DIAGNOSIS — R079 Chest pain, unspecified: Secondary | ICD-10-CM

## 2017-03-19 DIAGNOSIS — G4733 Obstructive sleep apnea (adult) (pediatric): Secondary | ICD-10-CM

## 2017-03-19 DIAGNOSIS — E782 Mixed hyperlipidemia: Secondary | ICD-10-CM

## 2017-03-19 DIAGNOSIS — I10 Essential (primary) hypertension: Secondary | ICD-10-CM | POA: Diagnosis not present

## 2017-03-19 HISTORY — DX: Other chest pain: R07.89

## 2017-03-19 NOTE — Progress Notes (Signed)
Cardiology Consultation:    Date:  03/19/2017   ID:  [name], DOB 03-27-1941, MRN [id number]  PCP:  Algis Greenhouse, MD  Cardiologist:  Jenne Campus, MD   Referring MD: Algis Greenhouse, MD   No chief complaint on file. She's having chest pain  History of Present Illness:    [name] is a 76 y.o. female who is being seen today for the evaluation of Chest pain at the request of [name], Jamie Graff, MD. Patient is a lady today so many years ago. She wanted to be seen because of chest pain. About 3 weeks ago she was sitting on the chair and started having pain in the left arm. Shortly after that she started having tightness like a brick in her chest she said that sensation lasted for about 1 minute there is no shortness of breath no sweating associated with this sensation. Then it things come back to normal. She graded the sensation and 4-5 scale up to 10. Since that time she's doing well. Denies having any problems. She is quite sedentary she tells me that she's tried to exercise she does have ability to measure her steps that she does about 08-2998 steps every day.  Past Medical History:  Diagnosis Date  . Cancer ([place])    breast, 2002  . Diabetes mellitus without complication ([place])   . Hypercholesteremia   . Hypertension     Past Surgical History:  Procedure Laterality Date  . ABDOMINAL HYSTERECTOMY  1976   total  . BREAST LUMPECTOMY Right 2002   chemo, radiation  . CATARACT EXTRACTION, BILATERAL  2018  . FOOT TENDON SURGERY Right 1990  . REPLACEMENT TOTAL KNEE     bilateral  . TOTAL SHOULDER REPLACEMENT Right     Current Medications: Current Meds  Medication Sig  . escitalopram (LEXAPRO) 10 MG tablet Take 20 mg by mouth daily.  . furosemide (LASIX) 40 MG tablet Take 40 mg by mouth daily.  Marland Kitchen levothyroxine (SYNTHROID, LEVOTHROID) 150 MCG tablet 150 mcg daily.  . metFORMIN (GLUCOPHAGE) 500 MG tablet Take 1,000 mg by mouth 2 (two) times daily.  . valsartan  (DIOVAN) 160 MG tablet Take 320 mg by mouth daily. 12/02/16 has not taken yet today     Allergies:   Metformin   Social History   Social History  . Marital status: Married    Spouse name: [name]  . Number of children: 0  . Years of education: 87   Occupational History  .      retired   Social History Main Topics  . Smoking status: Never Smoker  . Smokeless tobacco: Never Used  . Alcohol use No  . Drug use: No  . Sexual activity: Not Asked   Other Topics Concern  . None   Social History Narrative   Lives with husband   Caffeine - coffee, 20-3 cups, Diet Coke- 3, 12 oz daily     Family History: The patient's family history includes Cancer in her brother, other, and sister; Heart attack in her brother; Heart failure in her father; Memory loss in her sister; Stroke in her brother and mother. ROS:   Please see the history of present illness.    All 14 point review of systems negative except as described per history of present illness.  EKGs/Labs/Other Studies Reviewed:    The following studies were reviewed today:     Recent Labs: No results found for requested labs within last 8760 hours.  Recent Lipid Panel No results found for: CHOL, TRIG, HDL, CHOLHDL, VLDL, LDLCALC, LDLDIRECT  Physical Exam:    VS:  BP 140/90 (BP Location: Right Arm, Patient Position: Sitting, Cuff Size: Large)   Pulse 70   Ht 5\' 6"  (1.676 m)   Wt 275 lb 12.8 oz (125.1 kg)   SpO2 98%   BMI 44.52 kg/m     Wt Readings from Last 3 Encounters:  03/19/17 275 lb 12.8 oz (125.1 kg)  03/17/17 278 lb 6.4 oz (126.3 kg)  01/09/17 272 lb (123.4 kg)     GEN:  Well nourished, well developed in no acute distress HEENT: Normal NECK: No JVD; No carotid bruits LYMPHATICS: No lymphadenopathy CARDIAC: RRR, no murmurs, no rubs, no gallops RESPIRATORY:  Clear to auscultation without rales, wheezing or rhonchi  ABDOMEN: Soft, non-tender, non-distended MUSCULOSKELETAL:  No edema; No deformity  SKIN:  Warm and dry NEUROLOGIC:  Alert and oriented x 3 PSYCHIATRIC:  Normal affect   ASSESSMENT:    1. Essential hypertension   2. Obstructive sleep apnea   3. Hyperlipidemia, mixed   4. Morbid obesity with BMI of 40.0-44.9, adult ([place])    PLAN:    In order of problems listed above:  1. Atypical chest pain: She does have significant risk factors for coronary artery disease I think it would prudent to do stress test to rule out inducible ischemia. She is taking already aspirin and cholesterol-lowering medication which I will continue. Since she had only 1 episode chest pain at a think I need to put him on long-acting antianginal medications. She will be scheduled to have [place]. 2. Obstructive sleep apnea: That being managed by internal medicine team. 3. Dyslipidemia: On cholesterol medication: We'll call primary care physician to get fasting lipid profile. 4. Diabetes mellitus: Followed by primary care physician apparently some difficulty controlling it but she is getting better. 5. Essential hypertension. Blood pressure today in the high normal limits I will continue monitoring she may require some adjustment of her medications.   Medication Adjustments/Labs and Tests Ordered: Current medicines are reviewed at length with the patient today.  Concerns regarding medicines are outlined above.  No orders of the defined types were placed in this encounter.  No orders of the defined types were placed in this encounter.   Signed, Park Liter, MD, [facility]. 03/19/2017 9:22 AM    [place]

## 2017-03-19 NOTE — Patient Instructions (Addendum)
Medication Instructions:  Your physician recommends that you continue on your current medications as directed. Please refer to the Current Medication list given to you today.  Labwork: None  Testing/Procedures: Your physician has requested that you have en exercise stress myoview. For further information please visit [email or web address]. Please follow instruction sheet, as given.  Please report to [address], [address] [place], [place] the day of your testing.   Follow-Up: Your physician recommends that you schedule a follow-up appointment in: 1 month   Any Other Special Instructions Will Be Listed Below (If Applicable).  Please note that any paperwork needing to be filled out by the provider will need to be addressed at the front desk prior to seeing the provider. Please note that any paperwork FMLA, Disability or other documents regarding health condition is subject to a $25.00 charge that must be received prior to completion of paperwork in the form of a money order or check.    If you need a refill on your cardiac medications before your next appointment, please call your pharmacy.

## 2017-03-31 ENCOUNTER — Telehealth (HOSPITAL_COMMUNITY): Payer: Self-pay | Admitting: *Deleted

## 2017-03-31 NOTE — Telephone Encounter (Signed)
Patient given detailed instructions per Myocardial Perfusion Study Information Sheet for the test on 0912/18 at 1230. Patient notified to arrive 15 minutes early and that it is imperative to arrive on time for appointment to keep from having the test rescheduled.  If you need to cancel or reschedule your appointment, please call the office within 24 hours of your appointment. . Patient verbalized understanding.Daaron Dimarco, [name]

## 2017-04-02 ENCOUNTER — Ambulatory Visit (HOSPITAL_COMMUNITY): Payer: Medicare Other | Attending: Cardiovascular Disease

## 2017-04-02 DIAGNOSIS — R079 Chest pain, unspecified: Secondary | ICD-10-CM | POA: Diagnosis not present

## 2017-04-02 MED ORDER — REGADENOSON 0.4 MG/5ML IV SOLN
0.4000 mg | Freq: Once | INTRAVENOUS | Status: AC
  Administered 2017-04-02: 0.4 mg via INTRAVENOUS

## 2017-04-02 MED ORDER — TECHNETIUM TC 99M TETROFOSMIN IV KIT
32.9000 | PACK | Freq: Once | INTRAVENOUS | Status: AC | PRN
  Administered 2017-04-02: 32.9 via INTRAVENOUS
  Filled 2017-04-02: qty 33

## 2017-04-03 ENCOUNTER — Ambulatory Visit (HOSPITAL_COMMUNITY): Payer: Medicare Other | Attending: Cardiovascular Disease

## 2017-04-03 LAB — MYOCARDIAL PERFUSION IMAGING
CHL CUP NUCLEAR SSS: 5
LHR: 0.27
LV dias vol: 82 mL (ref 46–106)
LV sys vol: 30 mL
Peak HR: 105 {beats}/min
Rest HR: 81 {beats}/min
SDS: 2
SRS: 3
TID: 1.03

## 2017-04-03 MED ORDER — TECHNETIUM TC 99M TETROFOSMIN IV KIT
32.0000 | PACK | Freq: Once | INTRAVENOUS | Status: AC | PRN
  Administered 2017-04-03: 32 via INTRAVENOUS
  Filled 2017-04-03: qty 32

## 2017-04-21 ENCOUNTER — Ambulatory Visit (INDEPENDENT_AMBULATORY_CARE_PROVIDER_SITE_OTHER): Payer: Medicare Other | Admitting: Neurology

## 2017-04-21 ENCOUNTER — Ambulatory Visit: Payer: Medicare Other | Admitting: Cardiology

## 2017-04-21 ENCOUNTER — Encounter: Payer: Self-pay | Admitting: Neurology

## 2017-04-21 VITALS — BP 124/83 | HR 93 | Ht 66.0 in | Wt 271.0 lb

## 2017-04-21 DIAGNOSIS — Z9989 Dependence on other enabling machines and devices: Secondary | ICD-10-CM | POA: Diagnosis not present

## 2017-04-21 DIAGNOSIS — Z6841 Body Mass Index (BMI) 40.0 and over, adult: Secondary | ICD-10-CM

## 2017-04-21 DIAGNOSIS — G4733 Obstructive sleep apnea (adult) (pediatric): Secondary | ICD-10-CM

## 2017-04-21 DIAGNOSIS — R269 Unspecified abnormalities of gait and mobility: Secondary | ICD-10-CM

## 2017-04-21 NOTE — Progress Notes (Signed)
Subjective:    Patient ID: [name] is a 76 y.o. female.  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 The patient is unaccompanied today. I first met her 01/09/2017 at the request of Dr. Leta Arellano, at which time she reported a prior diagnosis of OSA. She had tried CPAP but stopped using her machine several years ago. I suggested we proceed with sleep study testing. She had a baseline sleep study, followed by a CPAP titration study. I went over her test results with her in detail today. Baseline sleep study from 01/21/2017 showed a sleep efficiency of 94.9%, sleep latency of 16.5 minutes and REM latency of 184.5 minutes. She had an increased percentage of stage II sleep, slow-wave sleep of 13.4% and REM sleep reduced at 13.9%. Total AHI was 8.3 per hour, REM AHI 53 per hour, supine sleep was not achieved. Average oxygen saturation was 92% with a nadir of 82%. Time below 89% saturation was 107 minutes for the night. She had no significant PLMS. Based on her medical history and her test results I suggested we proceed with a CPAP titration study. She had this on 02/10/2017. Sleep efficiency was 93.1%, sleep latency 24 minutes and REM latency 119 minutes. She had an increased percentage of slow-wave sleep and increased percentage of REM sleep. She was fitted with nasal pillows and CPAP was titrated from 5 cm to 6 cm. On the final pressure her AHI was 0 per hour with nonsupine REM sleep achieved and O2 nadir of 88%. Based on her test results and O2 nadir on the final pressure of 88% I suggested a home CPAP  pressure of 7 cm.  Today, 04/21/2017: I reviewed her CPAP compliance data from 03/18/2017 through 04/16/2017, which is a total of 30 days, during which time she used her CPAP 29 days with percent used days greater than 4 hours at 73%, indicating adequate compliance with an average usage of 6 hours and 26 minutes, residual AHI 1.4 per hour, leak acceptable with the 95th percentile at 13 L/m on a pressure of 7 cm with EPR of 1. She reports no significant difference in her symptoms after starting CPAP therapy. She was out of town recently and while she brought the machine with her to the trip, she ended up not using it. She still has nocturia.  Using nasal pillows. Overall, she is able to tolerate it better than in the past.   The patient's allergies, current medications, family history, past medical history, past social history, past surgical history and problem list were reviewed and updated as appropriate.   Previously (copied from previous notes for reference):   01/09/2017: (She) reports a prior diagnosis of OSA. She had a sleep study many years ago, I reviewed her study results from 06/02/2003, which showed a sleep efficiency of 73%, increased stage II sleep, no significant PLMS, total AHI around 6.1 per hour, O2 nadir 78% during REM sleep. She reports that she was placed on CPAP therapy, has stopped using it a few years ago, did not bring her CPAP machine. Her Epworth sleepiness score is 3 out of 24 today, her fatigue score is 25 out of 63.  I reviewed your office note from 12/02/2016. She is married and lives with her husband. She has no children. She is retired, nonsmoker, does not currently consume any alcohol, denies illicit drug use, and drinks caffeine in the form of coffee, 2 cups in AM, and 3 small bottles of diet Cola.  She has gained weight over the years. Lost some in weight in the last year or so, gained most of it back. No family history of sleep apnea, some feet discomfort, not telltale  for RLS.  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 Bedtime is between 10 and 11 generally speaking but she has difficulty falling asleep and staying asleep, has had sleep difficulties in that regard for years, since she was in college she reports. She reports no family history of OSA. She has significant nocturia about 3-4 times on an average night. Wake up time is between 5 and 6 generally speaking. She tried CPAP years ago and found it cumbersome and aggravating. She may have had some improvement in her daytime energy when she was using CPAP.   Her Past Medical History Is Significant For: Past Medical History:  Diagnosis Date  . Cancer ([place])    breast, 2002  . Diabetes mellitus without complication ([place])   . Hypercholesteremia   . Hypertension     Her Past Surgical History Is Significant For: Past Surgical History:  Procedure Laterality Date  . ABDOMINAL HYSTERECTOMY  1976   total  . BREAST LUMPECTOMY Right 2002   chemo, radiation  . CATARACT EXTRACTION, BILATERAL  2018  . FOOT TENDON SURGERY Right 1990  . REPLACEMENT TOTAL KNEE     bilateral  . TOTAL SHOULDER REPLACEMENT Right     Her Family History Is Significant For: Family History  Problem Relation Age of Onset  . Stroke Mother   . Heart failure Father   . Cancer Sister        breast  . Memory loss Sister   . Cancer Brother   . Cancer Other        leukkemia, GYN cancer  . Stroke Brother   . Heart attack Brother     Her Social History Is Significant For: Social History   Social History  . Marital status: Married    Spouse name: Linsey  . Number of children: 0  . Years of education: 23   Occupational History  .      retired   Social History Main Topics  . Smoking status: Never Smoker  . Smokeless tobacco: Never Used  . Alcohol use No  . Drug use: No  . Sexual activity: Not Asked   Other Topics Concern  . None   Social History Narrative   Lives with husband   Caffeine - coffee,  20-3 cups, Diet Coke- 3, 12 oz daily    Her Allergies Are:  Allergies  Allergen Reactions  . Metformin     Other reaction(s): Other (See Comments) dysegusia  :   Her Current Medications Are:  Outpatient Encounter Prescriptions as of 04/21/2017  Medication Sig  . escitalopram (LEXAPRO) 10 MG tablet Take 20 mg by mouth daily.  . furosemide (LASIX) 40 MG tablet Take 40 mg by mouth daily.  Marland Kitchen levothyroxine (SYNTHROID, LEVOTHROID) 150 MCG tablet 150 mcg daily.  . metFORMIN (GLUCOPHAGE) 500 MG tablet Take 1,000 mg by mouth 2 (two) times daily.  . valsartan (DIOVAN) 160 MG tablet Take 320 mg by mouth daily. 12/02/16 has not taken  yet today   No facility-administered encounter medications on file as of 04/21/2017.   :  Review of Systems:  Out of a complete 14 point review of systems, all are reviewed and negative with the exception of these symptoms as listed below: Review of Systems  Neurological:       Pt reports that she does not feel any better after using her cpap.    Objective:  Neurological Exam  Physical Exam Physical Examination:   Vitals:   04/21/17 0928  BP: 124/83  Pulse: 93    General Examination: The patient is a very pleasant 76 y.o. female in no acute distress. She appears well-developed and well-nourished and well groomed.   HEENT: Normocephalic, atraumatic, pupils are unequal, left larger than R, round and reactive to light and accommodation. Extraocular tracking is good without limitation to gaze excursion or nystagmus noted. Normal smooth pursuit is noted. Hearing is grossly intact. Face is symmetric with normal facial animation and normal facial sensation. Speech is clear with no dysarthria noted. There is no hypophonia. There is no lip, neck/head, jaw or voice tremor. Neck is supple with full range of passive and active motion. There are no carotid bruits on auscultation. Oropharynx exam reveals: mild mouth dryness, marginal dental hygiene and moderate airway  crowding, Mallampati is class III. Tonsils are absent.   Chest: Clear to auscultation without wheezing, rhonchi or crackles noted.  Heart: S1+S2+0, regular and normal without murmurs, rubs or gallops noted.   Abdomen: Soft, non-tender and non-distended with normal bowel sounds appreciated on auscultation.  Extremities: There is 1+ pitting edema in the distal lower extremities bilaterally. Pedal pulses are intact.  Skin: Warm and dry without trophic changes noted.  Musculoskeletal: exam reveals no obvious joint deformities, both knees replaced.   Neurologically:  Mental status: The patient is awake, alert and oriented in all 4 spheres. Her immediate and remote memory, attention, language skills and fund of knowledge seem to be mildly impaired. Speech is slow, but clear with normal prosody and enunciation. Thought process is linear. Mood is normal and affect is somewhat blunted.  Cranial nerves II - XII are as described above under HEENT exam. In addition: shoulder shrug is normal with equal shoulder height noted. Motor exam: Normal bulk, strength and tone is noted. There is no drift, tremor or rebound. Romberg is not tested for safety. Fine motor skills and coordination: globally mildly impaired.  Cerebellar testing: No dysmetria or intention tremor.  Sensory exam: intact to light touch.  Gait, station and balance: She stands with difficulty and holds on. No cane or walker. Her upper body is tilted to the left, she walks cautiously and slightly wide-based. She walks with a limp, slight waddle, unchanged gait.                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 She had a baseline sleep study, followed by a CPAP titration study, both studies in July 2018. She has a prior diagnosis of obstructive sleep apnea but intolerance to CPAP in the past. She's compliant with treatment, she indicates better tolerance of her treatment and is motivated to continue with CPAP  although she has not yet noticed a telltale improvement in her daytime symptoms. Nevertheless, she is willing to continue with treatment and is commended for her treatment adherence thus far. We talked about her sleep study results in detail today and also reviewed her compliance data. Physical exam is stable. She is encouraged to use a cane or walker when she leaves the house for her gait disorder. She is advised to follow-up in 6 months for sleep apnea, she can see one of our nurse practitioners next time and she is also advised to keep her follow-up appointment with Dr. Leta Arellano as planned.  I answered all her questions today and she was in agreement. I spent 25 minutes in total face-to-face time with the patient, more than 50% of which was spent in counseling and coordination of care, reviewing test results, reviewing medication and discussing or reviewing the diagnosis of OSA, its prognosis and treatment options. Pertinent laboratory and imaging test results that were available during this visit with the patient were reviewed by me and considered in my medical decision making (see chart for details).

## 2017-04-21 NOTE — Patient Instructions (Addendum)
Please continue using your CPAP regularly. While your insurance requires that you use CPAP at least 4 hours each night on 70% of the nights, I recommend, that you not skip any nights and use it throughout the night if you can. Getting used to CPAP and staying with the treatment long term does take time and patience and discipline. Untreated obstructive sleep apnea when it is moderate to severe can have an adverse impact on cardiovascular health and raise her risk for heart disease, arrhythmias, hypertension, congestive heart failure, stroke and diabetes. Untreated obstructive sleep apnea causes sleep disruption, nonrestorative sleep, and sleep deprivation. This can have an impact on your day to day functioning and cause daytime sleepiness and impairment of cognitive function, memory loss, mood disturbance, and problems focussing. Using CPAP regularly can improve these symptoms.  Keep up the good work! We can see you in 6 months for sleep apnea check up. You can see one of our nurse practitioners.

## 2017-04-22 ENCOUNTER — Ambulatory Visit (INDEPENDENT_AMBULATORY_CARE_PROVIDER_SITE_OTHER): Payer: Medicare Other | Admitting: Cardiology

## 2017-04-22 ENCOUNTER — Encounter: Payer: Self-pay | Admitting: Cardiology

## 2017-04-22 VITALS — BP 108/62 | HR 76 | Resp 14 | Ht 66.0 in | Wt 269.8 lb

## 2017-04-22 DIAGNOSIS — E1140 Type 2 diabetes mellitus with diabetic neuropathy, unspecified: Secondary | ICD-10-CM | POA: Diagnosis not present

## 2017-04-22 DIAGNOSIS — Z6841 Body Mass Index (BMI) 40.0 and over, adult: Secondary | ICD-10-CM | POA: Diagnosis not present

## 2017-04-22 DIAGNOSIS — E782 Mixed hyperlipidemia: Secondary | ICD-10-CM

## 2017-04-22 DIAGNOSIS — I10 Essential (primary) hypertension: Secondary | ICD-10-CM

## 2017-04-22 DIAGNOSIS — R0789 Other chest pain: Secondary | ICD-10-CM

## 2017-04-22 NOTE — Patient Instructions (Signed)
Medication Instructions:  Your physician recommends that you continue on your current medications as directed. Please refer to the Current Medication list given to you today.  Labwork: None   Testing/Procedures: None   Follow-Up: Your physician wants you to follow-up in: 5 months. You will receive a reminder letter in the mail two months in advance. If you don't receive a letter, please call our office to schedule the follow-up appointment.  Any Other Special Instructions Will Be Listed Below (If Applicable).  Please note that any paperwork needing to be filled out by the provider will need to be addressed at the front desk prior to seeing the provider. Please note that any paperwork FMLA, Disability or other documents regarding health condition is subject to a $25.00 charge that must be received prior to completion of paperwork in the form of a money order or check.     If you need a refill on your cardiac medications before your next appointment, please call your pharmacy.

## 2017-04-22 NOTE — Progress Notes (Signed)
Cardiology Office Note:    Date:  04/22/2017   ID:  [name], DOB [birth year], MRN [id number]  PCP:  Algis Greenhouse, MD  Cardiologist:  Jenne Campus, MD    Referring MD: Algis Greenhouse, MD   Chief Complaint  Patient presents with  . 1 month follow up  I'm feeling much better  History of Present Illness:    [name] is a 76 y.o. female  with atypical chest pain and multiple risk factors for coronary artery disease. I did stress to her and stressed this was negative. Since that time I've seen him last time there was no obstruction of chest pain. We talked in length about the situation. I think the key will be to modify her risk factors for coronary artery disease. Therefore, I asked her to start exercising on a regular basis. I advised her to walk at least 10 minutes preferably twice daily. We talked also about dyslipidemia she takes simvastatin which I will continue: I will call primary care physician to get fasting lipid profile. We talked also about her diabetes that she admitted this poorly controlled because of her dietary indiscretions. I told her that this is upset the essential to keep her diabetes under control in order to prevent complications of it. She said that she will pay more attention to it.  Past Medical History:  Diagnosis Date  . Cancer ([place])    breast, 2002  . Diabetes mellitus without complication ([place])   . Hypercholesteremia   . Hypertension     Past Surgical History:  Procedure Laterality Date  . ABDOMINAL HYSTERECTOMY  1976   total  . BREAST LUMPECTOMY Right 2002   chemo, radiation  . CATARACT EXTRACTION, BILATERAL  2018  . FOOT TENDON SURGERY Right 1990  . REPLACEMENT TOTAL KNEE     bilateral  . TOTAL SHOULDER REPLACEMENT Right     Current Medications: Current Meds  Medication Sig  . escitalopram (LEXAPRO) 10 MG tablet Take 20 mg by mouth daily.  . furosemide (LASIX) 40 MG tablet Take 40 mg by mouth daily.  Marland Kitchen levothyroxine  (SYNTHROID, LEVOTHROID) 150 MCG tablet 150 mcg daily.  . metFORMIN (GLUCOPHAGE) 500 MG tablet Take 1,000 mg by mouth 2 (two) times daily.  . valsartan (DIOVAN) 160 MG tablet Take 320 mg by mouth daily. 12/02/16 has not taken yet today     Allergies:   Metformin   Social History   Social History  . Marital status: Married    Spouse name: Linsey  . Number of children: 0  . Years of education: 80   Occupational History  .      retired   Social History Main Topics  . Smoking status: Never Smoker  . Smokeless tobacco: Never Used  . Alcohol use No  . Drug use: No  . Sexual activity: Not Asked   Other Topics Concern  . None   Social History Narrative   Lives with husband   Caffeine - coffee, 20-3 cups, Diet Coke- 3, 12 oz daily     Family History: The patient's family history includes Cancer in her brother, other, and sister; Heart attack in her brother; Heart failure in her father; Memory loss in her sister; Stroke in her brother and mother. ROS:   Please see the history of present illness.    All 14 point review of systems negative except as described per history of present illness  EKGs/Labs/Other Studies Reviewed:      Recent  Labs: No results found for requested labs within last 8760 hours.  Recent Lipid Panel No results found for: CHOL, TRIG, HDL, CHOLHDL, VLDL, LDLCALC, LDLDIRECT  Physical Exam:    VS:  BP 108/62   Pulse 76   Resp 14   Ht 5\' 6"  (1.676 m)   Wt 269 lb 12.8 oz (122.4 kg)   BMI 43.55 kg/m     Wt Readings from Last 3 Encounters:  04/22/17 269 lb 12.8 oz (122.4 kg)  04/21/17 271 lb (122.9 kg)  03/19/17 275 lb 12.8 oz (125.1 kg)     GEN:  Well nourished, well developed in no acute distress HEENT: Normal NECK: No JVD; No carotid bruits LYMPHATICS: No lymphadenopathy CARDIAC: RRR, no murmurs, no rubs, no gallops RESPIRATORY:  Clear to auscultation without rales, wheezing or rhonchi  ABDOMEN: Soft, non-tender, non-distended MUSCULOSKELETAL:   No edema; No deformity  SKIN: Warm and dry LOWER EXTREMITIES: no swelling NEUROLOGIC:  Alert and oriented x 3 PSYCHIATRIC:  Normal affect   ASSESSMENT:    1. Essential hypertension   2. Type 2 diabetes mellitus with diabetic neuropathy, without long-term current use of insulin ([place])   3. Atypical chest pain   4. Hyperlipidemia, mixed   5. Morbid obesity with BMI of 40.0-44.9, adult ([place])    PLAN:    In order of problems listed above:  1. Essential hypertension: Blood pressure appears to be well-controlled today. 2. Type 2 diabetes: Poorly controlled she understands the problem try to improve control of it 3. Atypical chest pain: Stress test negative: We'll continue present conservative approach. 4. Hyperlipidemia: I will call primary care physician to get fasting lipid profile. 5. Morbid obesity: She can benefit from exercise program she does not want to join and then program but she promised to walk at least twice a day for 10 minutes each time.    Medication Adjustments/Labs and Tests Ordered: Current medicines are reviewed at length with the patient today.  Concerns regarding medicines are outlined above.  No orders of the defined types were placed in this encounter.  Medication changes: No orders of the defined types were placed in this encounter.   Signed, Park Liter, MD, [facility] 04/22/2017 11:33 AM    [place]

## 2017-08-25 DIAGNOSIS — Z853 Personal history of malignant neoplasm of breast: Secondary | ICD-10-CM | POA: Diagnosis not present

## 2017-09-01 DIAGNOSIS — R269 Unspecified abnormalities of gait and mobility: Secondary | ICD-10-CM | POA: Insufficient documentation

## 2017-10-21 ENCOUNTER — Ambulatory Visit: Payer: Medicare Other | Admitting: Adult Health

## 2017-10-27 ENCOUNTER — Encounter: Payer: Self-pay | Admitting: *Deleted

## 2017-10-28 ENCOUNTER — Encounter: Payer: Self-pay | Admitting: Diagnostic Neuroimaging

## 2017-10-28 ENCOUNTER — Ambulatory Visit (INDEPENDENT_AMBULATORY_CARE_PROVIDER_SITE_OTHER): Payer: Medicare Other | Admitting: Diagnostic Neuroimaging

## 2017-10-28 VITALS — BP 120/85 | HR 94 | Ht 66.0 in | Wt 265.6 lb

## 2017-10-28 DIAGNOSIS — R269 Unspecified abnormalities of gait and mobility: Secondary | ICD-10-CM | POA: Diagnosis not present

## 2017-10-28 DIAGNOSIS — M4802 Spinal stenosis, cervical region: Secondary | ICD-10-CM

## 2017-10-28 NOTE — Progress Notes (Signed)
[facility]  PATIENT: [name] DOB: 04-25-1941  REFERRING CLINICIAN: Dough, R HISTORY FROM: patient and husband REASON FOR VISIT: follow up     HISTORICAL  CHIEF COMPLAINT:  Chief Complaint  Patient presents with  . Gait Problem    rm 6, husband- Linsey, "4 recent falls, unsure why I fall; may have passed out after 1 fall"    HISTORY OF PRESENT ILLNESS:   UPDATE (10/28/17, VRP): Since last visit, doing worse and having more falls. Hit head, has had bruising. One time hit head and was knocked out temporarily. 4 major falls since last visit. Also with ~3 minor falls per month. A1c is 8.3. Difficulty with turning.   UPDATE (03/17/17, VRP): Since last visit, doing better with balance. No falls. Tolerating CPAP now.   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 For past 6-8 months patient has had intermittent balance and gait difficulty. She has difficulty turning around, tends to fall down, sometimes uses a cane, sometimes has dizziness. Patient also has significant neck pain and headache. Patient went to orthopedic surgeon who ordered MRI cervical spine showing multilevel degenerative changes. Also incidental small posterior pituitary cyst was noted. Therefore patient referred to me for further evaluation. Patient has history of sleep apnea, sleep study 2004 and AHI of 6. She was started on CPAP at that time. She was following with Dr. Gwenette Greet, last seen a 2009. She stopped using CPAP in 2015. Patient also has history of hypertension, diabetes, depression, breast cancer, bilateral knee replacement, shoulder replacement, insomnia, depression.  REVIEW OF SYSTEMS: Full 14 system review of systems performed and negative with exception of: insomnia numbness eye pain swelling in legs joint pain.    ALLERGIES: Allergies  Allergen Reactions  . Metformin     Other reaction(s): Other (See Comments) dysegusia    HOME  MEDICATIONS: Outpatient Medications Prior to Visit  Medication Sig Dispense Refill  . amLODipine (NORVASC) 5 MG tablet Take 5 mg by mouth daily.    . empagliflozin (JARDIANCE) 25 MG TABS tablet Take 25 mg by mouth daily.    Marland Kitchen escitalopram (LEXAPRO) 10 MG tablet Take 20 mg by mouth daily.    . furosemide (LASIX) 40 MG tablet Take 40 mg by mouth daily.    . irbesartan (AVAPRO) 300 MG tablet Take 300 mg by mouth daily.    Marland Kitchen levothyroxine (SYNTHROID, LEVOTHROID) 150 MCG tablet 150 mcg daily.    . metFORMIN (GLUCOPHAGE) 500 MG tablet Take 1,000 mg by mouth 2 (two) times daily.    . valsartan (DIOVAN) 160 MG tablet Take 320 mg by mouth daily. 12/02/16 has not taken yet today     No facility-administered medications prior to visit.     PAST MEDICAL HISTORY: Past Medical History:  Diagnosis Date  . Cancer ([place])    breast, 2002  . CKD (chronic kidney disease)    stage 3  . Diabetes mellitus without complication ([place])    type 2  . Gait disorder   . Hypercholesteremia   . Hypertension   . OSA on CPAP   . Osteoarthrosis   . Urinary incontinence     PAST SURGICAL HISTORY: Past Surgical History:  Procedure Laterality Date  . ABDOMINAL HYSTERECTOMY  1976   total  . BREAST LUMPECTOMY Right 2002   chemo, radiation  . CARPAL TUNNEL RELEASE Right   . CATARACT EXTRACTION, BILATERAL  2018  . FOOT TENDON SURGERY Right 1990  . REPLACEMENT TOTAL KNEE  bilateral  . TONSILLECTOMY    . TOTAL SHOULDER REPLACEMENT Right   . WISDOM TOOTH EXTRACTION      FAMILY HISTORY: Family History  Problem Relation Age of Onset  . Stroke Mother   . Hypertension Mother   . Heart failure Father   . Diabetes Father   . Cancer Sister        breast  . Memory loss Sister   . Cancer Brother        larynx  . Cancer Other        leukkemia, GYN cancer  . Stroke Brother   . Heart attack Brother     SOCIAL HISTORY:  Social History   Socioeconomic History  . Marital status: Married    Spouse name:  Linsey  . Number of children: 0  . Years of education: 56  . Highest education level: Not on file  Occupational History    Comment: retired  Scientific laboratory technician  . Financial resource strain: Not on file  . Food insecurity:    Worry: Not on file    Inability: Not on file  . Transportation needs:    Medical: Not on file    Non-medical: Not on file  Tobacco Use  . Smoking status: Never Smoker  . Smokeless tobacco: Never Used  Substance and Sexual Activity  . Alcohol use: No  . Drug use: No  . Sexual activity: Not on file  Lifestyle  . Physical activity:    Days per week: Not on file    Minutes per session: Not on file  . Stress: Not on file  Relationships  . Social connections:    Talks on phone: Not on file    Gets together: Not on file    Attends religious service: Not on file    Active member of club or organization: Not on file    Attends meetings of clubs or organizations: Not on file    Relationship status: Not on file  . Intimate partner violence:    Fear of current or ex partner: Not on file    Emotionally abused: Not on file    Physically abused: Not on file    Forced sexual activity: Not on file  Other Topics Concern  . Not on file  Social History Narrative   Lives with husband   Caffeine - coffee, 20-3 cups, Diet Coke- 3, 12 oz daily     PHYSICAL EXAM  GENERAL EXAM/CONSTITUTIONAL: Vitals:  Vitals:   10/28/17 0859  BP: 120/85  Pulse: 94  Weight: 265 lb 9.6 oz (120.5 kg)  Height: '5\' 6"'$  (1.676 m)   Body mass index is 42.87 kg/m. No exam data present  Patient is in no distress; well developed, nourished and groomed; neck is supple  CARDIOVASCULAR:  Examination of carotid arteries is normal; no carotid bruits  Regular rate and rhythm, no murmurs  Examination of peripheral vascular system by observation and palpation is normal  EYES:  Ophthalmoscopic exam of optic discs and posterior segments is normal; no papilledema or  hemorrhages  MUSCULOSKELETAL:  Gait, strength, tone, movements noted in Neurologic exam below  NEUROLOGIC: MENTAL STATUS:  No flowsheet data found.  awake, alert, oriented to person, place and time  recent and remote memory intact  normal attention and concentration  language fluent, comprehension intact, naming intact,   fund of knowledge appropriate  CRANIAL NERVE:   2nd - no papilledema on fundoscopic exam  2nd, 3rd, 4th, 6th - pupils equal and reactive to  light, visual fields full to confrontation, extraocular muscles intact, no nystagmus  5th - facial sensation symmetric  7th - facial strength symmetric  8th - hearing intact  9th - palate elevates symmetrically, uvula midline  11th - shoulder shrug symmetric  12th - tongue protrusion midline  MOTOR:   normal bulk and tone, full strength in the BUE  SLIGHTLY LIMITED IN RUE DUE TO SHOULDER PAIN  SENSORY:   normal and symmetric to light touch, temperature, vibration  DECR PP IN VIB IN FEET  COORDINATION:   finger-nose-finger, fine finger movements normal  REFLEXES:   deep tendon reflexes TRACE and symmetric  ABSENT AT KNEES AND ANKLES  GAIT/STATION:   narrow based gait; SLOW AND UNSTEADY    DIAGNOSTIC DATA (LABS, IMAGING, TESTING) - I reviewed patient records, labs, notes, testing and imaging myself where available.  Lab Results  Component Value Date   WBC 7.8 06/21/2009   HGB 11.3 (L) 06/21/2009   HCT 33.4 (L) 06/21/2009   MCV 93.2 06/21/2009   PLT 186 06/21/2009      Component Value Date/Time   NA 140 06/21/2009 0440   K 3.5 06/21/2009 0440   CL 102 06/21/2009 0440   CO2 32 06/21/2009 0440   GLUCOSE 141 (H) 06/21/2009 0440   BUN 11 06/21/2009 0440   CREATININE 0.77 06/21/2009 0440   CALCIUM 8.4 06/21/2009 0440   PROT 7.1 06/14/2009 1240   ALBUMIN 4.0 06/14/2009 1240   AST 20 06/14/2009 1240   ALT 23 06/14/2009 1240   ALKPHOS 85 06/14/2009 1240   BILITOT 0.3 06/14/2009  1240   GFRNONAA >60 06/21/2009 0440   GFRAA  06/21/2009 0440    >60        The eGFR has been calculated using the MDRD equation. This calculation has not been validated in all clinical situations. eGFR's persistently <60 mL/min signify possible Chronic Kidney Disease.   No results found for: CHOL, HDL, LDLCALC, LDLDIRECT, TRIG, CHOLHDL No results found for: HGBA1C No results found for: VITAMINB12 No results found for: TSH   11/25/16 A1c - 9.2  11/07/16 MRI cervical spine [I reviewed images myself and agree with interpretation. 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 - Incidental posterior pituitary cyst measuring 3 mm  12/28/16 MRI brain (with and without)  1. Scattered T2/FLAIR hyperintense foci in the hemispheres and pons consistent with mild chronic microvascular ischemic change. There is no atrophy. 2. 3-4 mm non enhancing benign-appearing cyst in the left posterior pituitary gland. 3. There are no acute findings. The brain has a normal enhancement pattern.     ASSESSMENT AND PLAN  77 y.o. year old female here with gain balance difficulty for past 6-8 months, also found to have incidental pituitary cyst on MRI cervical spine imaging. This is likely an incidental finding. Patient's gait difficulty is most likely multifactorial related to obesity, degenerative lumbar and cervical spine disease, degenerative knee and hip disease, deconditioning, depression, diabetic neuropathy.   Dx:  1. Cervical stenosis of spine   2. 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 For home use only DME 4 wheeled rolling walker with seat  . Ambulatory referral to [place]   Return if symptoms worsen or fail to improve, for return to PCP.    Penni Bombard, MD 02/26/8675, 7:20 AM Certified in Neurology, Neurophysiology and Neuroimaging  [facility] [address], [place], [place] [address] [phone]

## 2017-11-03 ENCOUNTER — Telehealth: Payer: Self-pay | Admitting: Diagnostic Neuroimaging

## 2017-11-03 NOTE — Telephone Encounter (Signed)
[name] returning RNs call stating he can be reach at [phone] to stay and wait for RN at time) also stating the husband is now home and can also be reached at [phone]

## 2017-11-03 NOTE — Telephone Encounter (Signed)
FYI Pt [name] with [facility] called to inform pt husband called and informed him pt hasnt been home for 2 days and instead of rescheduling they will call [facility] when they are ready to start.  PT [name] can be reached at [phone] no call back requested

## 2017-11-03 NOTE — Telephone Encounter (Signed)
Called husband who stated his wife is out of town. He stated she is uncertain if she wants PT. He stated he just talked to her, and she was going to call [facility]. He stated that she will decide if she wants therapy. He thanked this Therapist, sports for call.

## 2017-11-03 NOTE — Telephone Encounter (Signed)
Vinie Sill PT for clarification of message, LVM. Advised him this RN will call husband. Called only # listed for husband, Linsey on [place]. No answer, phone continuously rang. Will try to reach later.

## 2017-12-22 DIAGNOSIS — G629 Polyneuropathy, unspecified: Secondary | ICD-10-CM | POA: Insufficient documentation

## 2019-01-18 DIAGNOSIS — Z853 Personal history of malignant neoplasm of breast: Secondary | ICD-10-CM

## 2019-01-26 ENCOUNTER — Encounter: Payer: Self-pay | Admitting: Neurology

## 2019-01-26 ENCOUNTER — Ambulatory Visit (INDEPENDENT_AMBULATORY_CARE_PROVIDER_SITE_OTHER): Payer: Medicare Other | Admitting: Neurology

## 2019-01-26 ENCOUNTER — Other Ambulatory Visit: Payer: Self-pay

## 2019-01-26 VITALS — BP 141/83 | HR 82 | Temp 98.0°F | Ht 66.0 in | Wt 251.0 lb

## 2019-01-26 DIAGNOSIS — G4733 Obstructive sleep apnea (adult) (pediatric): Secondary | ICD-10-CM | POA: Diagnosis not present

## 2019-01-26 NOTE — Progress Notes (Signed)
Order for cpap supplies sent to [facility] via community message. Confirmation received that the order transmitted was successful.  

## 2019-01-26 NOTE — Patient Instructions (Signed)
I will renew your CPAP Supplies, please try to get back on your CPAP machine and use it regularly.  Your CPAP download report does indicate that when you are on your CPAP your sleep apnea is well treated. Please follow-up routinely in 1 year with the nurse practitioner.

## 2019-01-26 NOTE — Progress Notes (Signed)
Subjective:    Patient ID: [name] is a 78 y.o. female.  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 The patient is unaccompanied today.  I last saw her on 04/21/2017, at which time we talked about her sleep study results.  She was compliant with her CPAP.  She reported no significant change in her sleep quality or sleep pattern after starting it, set up date was 03/03/2017.  She was advised to follow-up in 6 months with the nurse practitioner.   Today, 01/26/2019: I reviewed her CPAP compliance data for the past 1 year from 01/24/2018 through 01/23/2019 during which time she used her machine only 17 days with percent used days greater than 4 hours at 3% only, indicating noncompliance, no usage detected after mid November last year.   She reports that she has not been using her CPAP machine due to lack of new supplies.  She was told by her DME company that she needed an appointment and a new prescription.  She does not sleep well generally speaking.  Some nights she has significant difficulty achieving sleep.  She does not take any medication for sleep.   The patient's allergies, current medications, family history, past medical history, past social history, past surgical history and problem list were reviewed and updated as appropriate.    Previously (copied from previous notes for reference):   I first met her 01/09/2017 at the request of Dr. Leta Baptist, at which time she reported a prior diagnosis of OSA. She had tried CPAP but stopped using her machine several years ago. I suggested we proceed  with sleep study testing. She had a baseline sleep study, followed by a CPAP titration study. I went over her test results with her in detail today. Baseline sleep study from 01/21/2017 showed a sleep efficiency of 94.9%, sleep latency of 16.5 minutes and REM latency of 184.5 minutes. She had an increased percentage of stage II sleep, slow-wave sleep of 13.4% and REM sleep reduced at 13.9%. Total AHI was 8.3 per hour, REM AHI 53 per hour, supine sleep was not achieved. Average oxygen saturation was 92% with a nadir of 82%. Time below 89% saturation was 107 minutes for the night. She had no significant PLMS. Based on her medical history and her test results I suggested we proceed with a CPAP titration study. She had this on 02/10/2017. Sleep efficiency was 93.1%, sleep latency 24 minutes and REM latency 119 minutes. She had an increased percentage of slow-wave sleep and increased percentage of REM sleep. She was fitted with nasal pillows and CPAP was titrated from 5 cm to 6 cm. On the final pressure her AHI was 0 per hour with nonsupine REM sleep achieved and O2 nadir of 88%. Based on her test results and O2 nadir on the final pressure of 88% I suggested a home CPAP pressure of 7 cm.   I reviewed her CPAP compliance data from 03/18/2017 through 04/16/2017, which is a total of 30 days, during which time she used her CPAP 29 days with percent used days greater than 4 hours at 73%, indicating adequate compliance with an average usage of 6  hours and 26 minutes, residual AHI 1.4 per hour, leak acceptable with the 95th percentile at 13 L/m on a pressure of 7 cm with EPR of 1.    01/09/2017: (She) reports a prior diagnosis of OSA. She had a sleep study many years ago, I reviewed her study results from 06/02/2003, which showed a sleep efficiency of 73%, increased stage II sleep, no significant PLMS, total AHI around 6.1 per hour, O2 nadir 78% during REM sleep. She reports that she was placed on CPAP therapy, has stopped  using it a few years ago, did not bring her CPAP machine. Her Epworth sleepiness score is 3 out of 24 today, her fatigue score is 25 out of 63. I reviewed your office note from 12/02/2016. She is married and lives with her husband. She has no children. She is retired, nonsmoker, does not currently consume any alcohol, denies illicit drug use, and drinks caffeine in the form of coffee, 2 cups in AM, and 3 small bottles of diet Cola.  She has gained weight over the years. Lost some in weight in the last year or so, gained most of it back. No family history of sleep apnea, some feet discomfort, not telltale for RLS.  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 Bedtime is between 10 and 11 generally speaking but she has difficulty falling asleep and staying asleep, has had sleep difficulties in that regard for years, since she was in college she reports. She reports no family history of OSA. She has significant nocturia about 3-4 times on an average night. Wake up time is between 5 and 6 generally speaking. She tried CPAP years ago and found it cumbersome and aggravating. She may have had some improvement in her daytime energy when she was using CPAP.  Her Past Medical History Is Significant For: Past Medical History:  Diagnosis Date  . Cancer ([place])    breast, 2002  . CKD (chronic kidney disease)    stage 3  . Diabetes mellitus without complication ([place])    type 2  . Gait disorder   . Hypercholesteremia   . Hypertension   . OSA on CPAP   . Osteoarthrosis   . Urinary incontinence     Her Past Surgical History Is Significant For: Past Surgical History:  Procedure Laterality Date  . ABDOMINAL HYSTERECTOMY  1976   total  . BREAST LUMPECTOMY Right 2002   chemo, radiation  . CARPAL TUNNEL RELEASE Right   . CATARACT EXTRACTION, BILATERAL  2018  . FOOT TENDON SURGERY Right 1990  . REPLACEMENT TOTAL KNEE     bilateral  . TONSILLECTOMY    . TOTAL SHOULDER REPLACEMENT Right   .  WISDOM TOOTH EXTRACTION      Her Family History Is Significant For: Family History  Problem Relation Age of Onset  . Stroke Mother   . Hypertension Mother   . Heart failure Father   . Diabetes Father   . Cancer Sister        breast  . Memory loss Sister   . Cancer Brother        larynx  . Cancer Other        leukkemia, GYN cancer  . Stroke Brother   . Heart attack Brother     Her Social History Is Significant For: Social History   Socioeconomic History  . Marital status: Married    Spouse name: Linsey  . Number of children: 0  . Years of  education: 64  . Highest education level: Not on file  Occupational History    Comment: retired  Scientific laboratory technician  . Financial resource strain: Not on file  . Food insecurity    Worry: Not on file    Inability: Not on file  . Transportation needs    Medical: Not on file    Non-medical: Not on file  Tobacco Use  . Smoking status: Never Smoker  . Smokeless tobacco: Never Used  Substance and Sexual Activity  . Alcohol use: No  . Drug use: No  . Sexual activity: Not on file  Lifestyle  . Physical activity    Days per week: Not on file    Minutes per session: Not on file  . Stress: Not on file  Relationships  . Social Herbalist on phone: Not on file    Gets together: Not on file    Attends religious service: Not on file    Active member of club or organization: Not on file    Attends meetings of clubs or organizations: Not on file    Relationship status: Not on file  Other Topics Concern  . Not on file  Social History Narrative   Lives with husband   Caffeine - coffee, 20-3 cups, Diet Coke- 3, 12 oz daily    Her Allergies Are:  Allergies  Allergen Reactions  . Metformin     Other reaction(s): Other (See Comments) dysegusia  :   Her Current Medications Are:  Outpatient Encounter Medications as of 01/26/2019  Medication Sig  . amLODipine (NORVASC) 5 MG tablet Take 5 mg by mouth daily.  . empagliflozin  (JARDIANCE) 25 MG TABS tablet Take 25 mg by mouth daily.  Marland Kitchen escitalopram (LEXAPRO) 10 MG tablet Take 20 mg by mouth daily.  . furosemide (LASIX) 40 MG tablet Take 40 mg by mouth daily.  . irbesartan (AVAPRO) 300 MG tablet Take 300 mg by mouth daily.  Marland Kitchen levothyroxine (SYNTHROID, LEVOTHROID) 150 MCG tablet 150 mcg daily.  . metFORMIN (GLUCOPHAGE) 500 MG tablet Take 1,000 mg by mouth 2 (two) times daily.  . valsartan (DIOVAN) 160 MG tablet Take 320 mg by mouth daily. 12/02/16 has not taken yet today   No facility-administered encounter medications on file as of 01/26/2019.   :  Review of Systems:  Out of a complete 14 point review of systems, all are reviewed and negative with the exception of these symptoms as listed below:  Review of Systems  Neurological:       Pt presents today to discuss her cpap. Pt reports that her cpap is going well.    Objective:  Neurological Exam  Physical Exam Physical Examination:   Vitals:   01/26/19 0921  BP: (!) 141/83  Pulse: 82  Temp: 98 F (36.7 C)    General Examination: The patient is a very pleasant 78 y.o. female in no acute distress. She appears well-developed and well-nourished and well groomed.   HEENT:Normocephalic, atraumatic, pupils are slightly unequal, but reactive to light. Extraocular tracking is good without limitation to gaze excursion or nystagmus noted. She wears eye glasses. Normal smooth pursuit is noted. Hearing is grossly intact. Face is symmetric with normal facial animation and normal facial sensation. Speech is clear with no dysarthria noted. There is no hypophonia. There is no lip, neck/head, jaw or voice tremor. Neck is supple with full range of passive and active motion. There are no carotid bruits on auscultation. Oropharynx exam reveals: mild to  moderatemouth dryness. Tonsils are absent. Tongue protrudes centrally in palate elevates symmetrically.   Chest:Clear to auscultation without wheezing, rhonchi or crackles  noted.  Heart:S1+S2+0, regular and normal without murmurs, rubs or gallops noted.   Abdomen:Soft, non-tender and non-distended with normal bowel sounds appreciated on auscultation.  Extremities:There is 1+pitting edema in the L distal lower extremity, trace on the R.   Skin: Warm and dry without trophic changes noted.  Musculoskeletal: exam reveals: both knees replaced. Genu valgus appearance.   Neurologically:  Mental status: The patient is awake, alert and oriented in all 4 spheres. Herimmediate and remote memory, attention, language skills and fund of knowledge are appropriate. Speech is slow, but clear with normal prosody and enunciation. Thought process is linear. Mood is normaland affect is somewhat blunted.  Cranial nerves II - XII are as described above under HEENT exam.  Motor exam: Normal bulk, strength and tone is noted. There is no drift, tremor. Romberg is not tested for safety. Fine motor skills and coordination: globally mildly impaired.  Cerebellar testing: No dysmetria or intention tremor.  Sensory exam: intact to light touch.  Gait, station and balance: Shestands with difficulty and Pushes herself up, she walks with a single-point cane. She walks with a limp on the L.    Assessment and Plan:  In summary, Jamie L Caisonis a very pleasant 78 year old female with an underlying medical history of breast cancer, arthritis, status post knee replacement surgeries, shoulder replacement surgery, depression, hypertension, diabetes, morbid obesity, and gait and balance issues with an incidental pituitary cyst, whopresents for follow-up consultation of her obstructive sleep apnea. She had a baseline sleep study, followed by a CPAP titration study, both studies in July 2018. She has a prior diagnosis of obstructive sleep apnea but intolerance to CPAP in the past. She is currently not using CPAP, since about mid Nov last year. She reports that she needs new  supplies.  She is encouraged to get back on treatment with her CPAP, we went over her baseline sleep study results again today and her most recent available compliance data.  She is advised to follow-up in 1 year to see the NP, I renewed her CPAP supplies today. I answered all her questions today and she was in agreement. I spent 15 minutes in total face-to-face time with the patient, more than 50% of which was spent in counseling and coordination of care, reviewing test results, reviewing medication and discussing or reviewing the diagnosis of OSA, its prognosis and treatment options. Pertinent laboratory and imaging test results that were available during this visit with the patient were reviewed by me and considered in my medical decision making (see chart for details).

## 2019-01-27 DIAGNOSIS — Z853 Personal history of malignant neoplasm of breast: Secondary | ICD-10-CM

## 2019-01-27 DIAGNOSIS — C50419 Malignant neoplasm of upper-outer quadrant of unspecified female breast: Secondary | ICD-10-CM

## 2019-02-01 DIAGNOSIS — C50411 Malignant neoplasm of upper-outer quadrant of right female breast: Secondary | ICD-10-CM | POA: Insufficient documentation

## 2019-02-01 DIAGNOSIS — Z170 Estrogen receptor positive status [ER+]: Secondary | ICD-10-CM | POA: Insufficient documentation

## 2019-02-03 DIAGNOSIS — Z01818 Encounter for other preprocedural examination: Secondary | ICD-10-CM

## 2019-02-16 DIAGNOSIS — Z09 Encounter for follow-up examination after completed treatment for conditions other than malignant neoplasm: Secondary | ICD-10-CM | POA: Insufficient documentation

## 2019-03-03 DIAGNOSIS — C50111 Malignant neoplasm of central portion of right female breast: Secondary | ICD-10-CM

## 2019-03-03 DIAGNOSIS — C50419 Malignant neoplasm of upper-outer quadrant of unspecified female breast: Secondary | ICD-10-CM | POA: Diagnosis not present

## 2019-03-03 DIAGNOSIS — Z853 Personal history of malignant neoplasm of breast: Secondary | ICD-10-CM

## 2019-03-22 DIAGNOSIS — T8131XA Disruption of external operation (surgical) wound, not elsewhere classified, initial encounter: Secondary | ICD-10-CM | POA: Insufficient documentation

## 2019-06-02 DIAGNOSIS — D0511 Intraductal carcinoma in situ of right breast: Secondary | ICD-10-CM

## 2019-06-02 DIAGNOSIS — Z853 Personal history of malignant neoplasm of breast: Secondary | ICD-10-CM | POA: Diagnosis not present

## 2019-06-02 DIAGNOSIS — Z79811 Long term (current) use of aromatase inhibitors: Secondary | ICD-10-CM

## 2019-08-11 ENCOUNTER — Ambulatory Visit (INDEPENDENT_AMBULATORY_CARE_PROVIDER_SITE_OTHER): Payer: Medicare PPO | Admitting: Cardiology

## 2019-08-11 ENCOUNTER — Encounter: Payer: Self-pay | Admitting: Cardiology

## 2019-08-11 ENCOUNTER — Other Ambulatory Visit: Payer: Self-pay

## 2019-08-11 VITALS — BP 148/100 | HR 96 | Ht 66.0 in | Wt 246.0 lb

## 2019-08-11 DIAGNOSIS — C50411 Malignant neoplasm of upper-outer quadrant of right female breast: Secondary | ICD-10-CM

## 2019-08-11 DIAGNOSIS — R0789 Other chest pain: Secondary | ICD-10-CM | POA: Diagnosis not present

## 2019-08-11 DIAGNOSIS — E782 Mixed hyperlipidemia: Secondary | ICD-10-CM

## 2019-08-11 DIAGNOSIS — E1140 Type 2 diabetes mellitus with diabetic neuropathy, unspecified: Secondary | ICD-10-CM

## 2019-08-11 DIAGNOSIS — I10 Essential (primary) hypertension: Secondary | ICD-10-CM

## 2019-08-11 DIAGNOSIS — Z170 Estrogen receptor positive status [ER+]: Secondary | ICD-10-CM

## 2019-08-11 NOTE — Patient Instructions (Signed)
Medication Instructions:  Your physician recommends that you continue on your current medications as directed. Please refer to the Current Medication list given to you today.  *If you need a refill on your cardiac medications before your next appointment, please call your pharmacy*  Lab Work: None.  If you have labs (blood work) drawn today and your tests are completely normal, you will receive your results only by: Marland Kitchen MyChart Message (if you have MyChart) OR . A paper copy in the mail If you have any lab test that is abnormal or we need to change your treatment, we will call you to review the results.  Testing/Procedures: Your physician has requested that you have an echocardiogram. Echocardiography is a painless test that uses sound waves to create images of your heart. It provides your doctor with information about the size and shape of your heart and how well your heart's chambers and valves are working. This procedure takes approximately one hour. There are no restrictions for this procedure.  Your physician has requested that you have a lexiscan myoview. For further information please visit [email or web address]. Please follow instruction sheet, as given.    Follow-Up: At [facility], you and your health needs are our priority.  As part of our continuing mission to provide you with exceptional heart care, we have created designated Provider Care Teams.  These Care Teams include your primary Cardiologist (physician) and Advanced Practice Providers (APPs -  Physician Assistants and Nurse Practitioners) who all work together to provide you with the care you need, when you need it.  Your next appointment:   2 month(s)  The format for your next appointment:   In Person  Provider:   Jenne Campus, MD  Other Instructions   Echocardiogram An echocardiogram is a procedure that uses painless sound waves (ultrasound) to produce an image of the heart. Images from an echocardiogram  can provide important information about:  Signs of coronary artery disease (CAD).  Aneurysm detection. An aneurysm is a weak or damaged part of an artery wall that bulges out from the normal force of blood pumping through the body.  Heart size and shape. Changes in the size or shape of the heart can be associated with certain conditions, including heart failure, aneurysm, and CAD.  Heart muscle function.  Heart valve function.  Signs of a past heart attack.  Fluid buildup around the heart.  Thickening of the heart muscle.  A tumor or infectious growth around the heart valves. Tell a health care provider about:  Any allergies you have.  All medicines you are taking, including vitamins, herbs, eye drops, creams, and over-the-counter medicines.  Any blood disorders you have.  Any surgeries you have had.  Any medical conditions you have.  Whether you are pregnant or may be pregnant. What are the risks? Generally, this is a safe procedure. However, problems may occur, including:  Allergic reaction to dye (contrast) that may be used during the procedure. What happens before the procedure? No specific preparation is needed. You may eat and drink normally. What happens during the procedure?   An IV tube may be inserted into one of your veins.  You may receive contrast through this tube. A contrast is an injection that improves the quality of the pictures from your heart.  A gel will be applied to your chest.  A wand-like tool (transducer) will be moved over your chest. The gel will help to transmit the sound waves from the transducer.  The sound waves  will harmlessly bounce off of your heart to allow the heart images to be captured in real-time motion. The images will be recorded on a computer. The procedure may vary among health care providers and hospitals. What happens after the procedure?  You may return to your normal, everyday life, including diet, activities, and  medicines, unless your health care provider tells you not to do that. Summary  An echocardiogram is a procedure that uses painless sound waves (ultrasound) to produce an image of the heart.  Images from an echocardiogram can provide important information about the size and shape of your heart, heart muscle function, heart valve function, and fluid buildup around your heart.  You do not need to do anything to prepare before this procedure. You may eat and drink normally.  After the echocardiogram is completed, you may return to your normal, everyday life, unless your health care provider tells you not to do that. This information is not intended to replace advice given to you by your health care provider. Make sure you discuss any questions you have with your health care provider. Document Revised: 10/29/2018 Document Reviewed: 08/10/2016 Elsevier Patient Education  2020 [employer].   Cardiac Nuclear Scan A cardiac nuclear scan is a test that measures blood flow to the heart when a person is resting and when he or she is exercising. The test looks for problems such as:  Not enough blood reaching a portion of the heart.  The heart muscle not working normally. You may need this test if:  You have heart disease.  You have had abnormal lab results.  You have had heart surgery or a balloon procedure to open up blocked arteries (angioplasty).  You have chest pain.  You have shortness of breath. In this test, a radioactive dye (tracer) is injected into your bloodstream. After the tracer has traveled to your heart, an imaging device is used to measure how much of the tracer is absorbed by or distributed to various areas of your heart. This procedure is usually done at a hospital and takes 2-4 hours. Tell a health care provider about:  Any allergies you have.  All medicines you are taking, including vitamins, herbs, eye drops, creams, and over-the-counter medicines.  Any problems you  or family members have had with anesthetic medicines.  Any blood disorders you have.  Any surgeries you have had.  Any medical conditions you have.  Whether you are pregnant or may be pregnant. What are the risks? Generally, this is a safe procedure. However, problems may occur, including:  Serious chest pain and heart attack. This is only a risk if the stress portion of the test is done.  Rapid heartbeat.  Sensation of warmth in your chest. This usually passes quickly.  Allergic reaction to the tracer. What happens before the procedure?  Ask your health care provider about changing or stopping your regular medicines. This is especially important if you are taking diabetes medicines or blood thinners.  Follow instructions from your health care provider about eating or drinking restrictions.  Remove your jewelry on the day of the procedure. What happens during the procedure?  An IV will be inserted into one of your veins.  Your health care provider will inject a small amount of radioactive tracer through the IV.  You will wait for 20-40 minutes while the tracer travels through your bloodstream.  Your heart activity will be monitored with an electrocardiogram (ECG).  You will lie down on an exam table.  Images of your heart will be taken for about 15-20 minutes.  You may also have a stress test. For this test, one of the following may be done: ? You will exercise on a treadmill or stationary bike. While you exercise, your heart's activity will be monitored with an ECG, and your blood pressure will be checked. ? You will be given medicines that will increase blood flow to parts of your heart. This is done if you are unable to exercise.  When blood flow to your heart has peaked, a tracer will again be injected through the IV.  After 20-40 minutes, you will get back on the exam table and have more images taken of your heart.  Depending on the type of tracer used, scans may  need to be repeated 3-4 hours later.  Your IV line will be removed when the procedure is over. The procedure may vary among health care providers and hospitals. What happens after the procedure?  Unless your health care provider tells you otherwise, you may return to your normal schedule, including diet, activities, and medicines.  Unless your health care provider tells you otherwise, you may increase your fluid intake. This will help to flush the contrast dye from your body. Drink enough fluid to keep your urine pale yellow.  Ask your health care provider, or the department that is doing the test: ? When will my results be ready? ? How will I get my results? Summary  A cardiac nuclear scan measures the blood flow to the heart when a person is resting and when he or she is exercising.  Tell your health care provider if you are pregnant.  Before the procedure, ask your health care provider about changing or stopping your regular medicines. This is especially important if you are taking diabetes medicines or blood thinners.  After the procedure, unless your health care provider tells you otherwise, increase your fluid intake. This will help flush the contrast dye from your body.  After the procedure, unless your health care provider tells you otherwise, you may return to your normal schedule, including diet, activities, and medicines. This information is not intended to replace advice given to you by your health care provider. Make sure you discuss any questions you have with your health care provider. Document Revised: 12/22/2017 Document Reviewed: 12/22/2017 Elsevier Patient Education  [place].

## 2019-08-11 NOTE — Progress Notes (Signed)
Cardiology Office Note:    Date:  08/11/2019   ID:  [name], DOB Sep 29, 1940, MRN [id number]  PCP:  Algis Greenhouse, MD  Cardiologist:  Jenne Campus, MD    Referring MD: Algis Greenhouse, MD   Chief Complaint  Patient presents with  . Hypertension    History of Present Illness:    [name] is a 79 y.o. female with past medical history significant for diabetes, hypertension, breast cancer, recent bilateral mastectomy done in the summer of this year, also history of chronic kidney disease.  She comes today to my office for follow-up.  She is being for he complained of having some chest pain.  Pain is retrosternal pressure-like lasting for few minutes not related to exercise.  She tells me that she does not do much she typically sits in the chair she walks around with a cane.  Pain typically happen when she is sitting in the chair.  There is no shortness of breath there is no sweating associated with this sensation.  She looks somewhat depressed to me she speaks very slowly and she said.  She does not smile.  She is taking antidepressant however she cannot tell me if she does any better from that aspect.  We talked in length about the situation he also told me that she thinks she is taking too much medications.  Past Medical History:  Diagnosis Date  . Atypical chest pain 03/19/2017  . Cancer ([place])    breast, 2002  . Chronic kidney disease, stage 3 07/14/2016  . CKD (chronic kidney disease)    stage 3  . Diabetes mellitus without complication ([place])    type 2  . Gait disorder   . Hypercholesteremia   . Hypertension   . Morbid obesity with BMI of 40.0-44.9, adult ([place]) 10/27/2014  . OSA on CPAP   . Osteoarthrosis   . Recurrent major depressive disorder, in remission ([place]) 01/11/2016  . Urinary incontinence     Past Surgical History:  Procedure Laterality Date  . ABDOMINAL HYSTERECTOMY  1976   total  . BREAST LUMPECTOMY Right 2002   chemo,  radiation  . CARPAL TUNNEL RELEASE Right   . CATARACT EXTRACTION, BILATERAL  2018  . FOOT TENDON SURGERY Right 1990  . REPLACEMENT TOTAL KNEE     bilateral  . TONSILLECTOMY    . TOTAL SHOULDER REPLACEMENT Right   . WISDOM TOOTH EXTRACTION      Current Medications: Current Meds  Medication Sig  . amLODipine (NORVASC) 5 MG tablet Take 5 mg by mouth daily.  . empagliflozin (JARDIANCE) 25 MG TABS tablet Take 25 mg by mouth daily.  Marland Kitchen escitalopram (LEXAPRO) 10 MG tablet Take 20 mg by mouth daily.  . furosemide (LASIX) 40 MG tablet Take 40 mg by mouth daily.  . irbesartan (AVAPRO) 300 MG tablet Take 300 mg by mouth daily.  Marland Kitchen levothyroxine (SYNTHROID, LEVOTHROID) 150 MCG tablet 150 mcg daily.  . metFORMIN (GLUCOPHAGE) 500 MG tablet Take 1,000 mg by mouth 2 (two) times daily.  . valsartan (DIOVAN) 160 MG tablet Take 320 mg by mouth daily. 12/02/16 has not taken yet today     Allergies:   Metformin   Social History   Socioeconomic History  . Marital status: Married    Spouse name: Linsey  . Number of children: 0  . Years of education: 73  . Highest education level: Not on file  Occupational History    Comment: retired  Tobacco  Use  . Smoking status: Never Smoker  . Smokeless tobacco: Never Used  Substance and Sexual Activity  . Alcohol use: No  . Drug use: No  . Sexual activity: Not on file  Other Topics Concern  . Not on file  Social History Narrative   Lives with husband   Caffeine - coffee, 20-3 cups, Diet Coke- 3, 12 oz daily   Social Determinants of Health   Financial Resource Strain:   . Difficulty of Paying Living Expenses: Not on file  Food Insecurity:   . Worried About Charity fundraiser in the Last Year: Not on file  . Ran Out of Food in the Last Year: Not on file  Transportation Needs:   . Lack of Transportation (Medical): Not on file  . Lack of Transportation (Non-Medical): Not on file  Physical Activity:   . Days of Exercise per Week: Not on file  .  Minutes of Exercise per Session: Not on file  Stress:   . Feeling of Stress : Not on file  Social Connections:   . Frequency of Communication with Friends and Family: Not on file  . Frequency of Social Gatherings with Friends and Family: Not on file  . Attends Religious Services: Not on file  . Active Member of Clubs or Organizations: Not on file  . Attends Archivist Meetings: Not on file  . Marital Status: Not on file     Family History: The patient's family history includes Cancer in her brother, sister, and another family member; Diabetes in her father; Heart attack in her brother; Heart failure in her father; Hypertension in her mother; Memory loss in her sister; Stroke in her brother and mother. ROS:   Please see the history of present illness.    All 14 point review of systems negative except as described per history of present illness  EKGs/Labs/Other Studies Reviewed:      Recent Labs: No results found for requested labs within last 8760 hours.  Recent Lipid Panel No results found for: CHOL, TRIG, HDL, CHOLHDL, VLDL, LDLCALC, LDLDIRECT  Physical Exam:    VS:  BP (!) 148/100 (BP Location: Left Arm, Patient Position: Sitting, Cuff Size: Large) Comment: has not taken meds  Pulse 96   Ht 5\' 6"  (1.676 m)   Wt 246 lb (111.6 kg)   SpO2 96%   BMI 39.71 kg/m     Wt Readings from Last 3 Encounters:  08/11/19 246 lb (111.6 kg)  01/26/19 251 lb (113.9 kg)  10/28/17 265 lb 9.6 oz (120.5 kg)     GEN:  Well nourished, well developed in no acute distress HEENT: Normal NECK: No JVD; No carotid bruits LYMPHATICS: No lymphadenopathy CARDIAC: RRR, no murmurs, no rubs, no gallops RESPIRATORY:  Clear to auscultation without rales, wheezing or rhonchi  ABDOMEN: Soft, non-tender, non-distended MUSCULOSKELETAL:  No edema; No deformity  SKIN: Warm and dry LOWER EXTREMITIES: no swelling NEUROLOGIC:  Alert and oriented x 3 PSYCHIATRIC:  Normal affect   ASSESSMENT:     1. Essential hypertension   2. Atypical chest pain   3. Malignant neoplasm of upper-outer quadrant of right breast in female, estrogen receptor positive ([place])   4. Type 2 diabetes mellitus with diabetic neuropathy, without long-term current use of insulin ([place])   5. Hyperlipidemia, mixed    PLAN:    In order of problems listed above:  1. Essential hypertension blood pressure is elevated today but she said she did not take her medications yet.  I told her to take medications every single day only in the morning when she gets up. 2. Atypical chest pain in this lady with multiple risk factors for coronary artery disease.  I will schedule her to have a stress test.  We will do Lexiscan.  Since the part of elevation I will ask her also to have an echocardiogram to assess left ventricle ejection fraction. 3. History of malignant neoplasm of breast status post mastectomy both sides this summer.  Apparently she got first time diagnosis of breast cancer 18 years ago.  Again she went to mastectomy with no major problems. 4. Type 2 diabetes.  Noted.  Follow-up by internal medicine team. 5. Dyslipidemia her LDL is 72, will wait for results of the stress test to press a little harder on potentially taking cholesterol-lowering medication.  She told me straight that she thinks she is taking too much of medication she is actually in agreement with me to cutting down some.  I wish to be able to add aspirin to her medical regiment and again in the future she may require also some statin.   Medication Adjustments/Labs and Tests Ordered: Current medicines are reviewed at length with the patient today.  Concerns regarding medicines are outlined above.  No orders of the defined types were placed in this encounter.  Medication changes: No orders of the defined types were placed in this encounter.   Signed, Park Liter, MD, [employer] 08/11/2019 8:36 AM    [place]

## 2019-09-08 ENCOUNTER — Telehealth: Payer: Self-pay | Admitting: *Deleted

## 2019-09-08 NOTE — Telephone Encounter (Signed)
Patient given detailed instructions per Myocardial Perfusion Study Information Sheet for the test on 09/15/2019 at 1115. Patient notified to arrive 15 minutes early and that it is imperative to arrive on time for appointment to keep from having the test rescheduled.  If you need to cancel or reschedule your appointment, please call the office within 24 hours of your appointment. . Patient verbalized understanding.[name], [name] No mychart

## 2019-09-29 ENCOUNTER — Telehealth: Payer: Self-pay | Admitting: *Deleted

## 2019-09-29 NOTE — Telephone Encounter (Signed)
Patient given detailed instructions per Myocardial Perfusion Study Information Sheet for the test on 10/05/2019 at 0815. Patient notified to arrive 15 minutes early and that it is imperative to arrive on time for appointment to keep from having the test rescheduled.  If you need to cancel or reschedule your appointment, please call the office within 24 hours of your appointment. . Patient verbalized understanding.[name], [name] No mychart available

## 2019-09-30 ENCOUNTER — Ambulatory Visit (INDEPENDENT_AMBULATORY_CARE_PROVIDER_SITE_OTHER): Payer: Medicare PPO

## 2019-09-30 ENCOUNTER — Other Ambulatory Visit: Payer: Self-pay

## 2019-09-30 DIAGNOSIS — I10 Essential (primary) hypertension: Secondary | ICD-10-CM

## 2019-09-30 DIAGNOSIS — R0789 Other chest pain: Secondary | ICD-10-CM | POA: Diagnosis not present

## 2019-09-30 NOTE — Progress Notes (Signed)
Complete echocardiogram has been performed.  Jimmy Armanie Ullmer RDCS, RVT 

## 2019-10-05 ENCOUNTER — Ambulatory Visit (INDEPENDENT_AMBULATORY_CARE_PROVIDER_SITE_OTHER): Payer: Medicare PPO

## 2019-10-05 ENCOUNTER — Other Ambulatory Visit: Payer: Self-pay

## 2019-10-05 VITALS — Ht 66.0 in | Wt 246.0 lb

## 2019-10-05 DIAGNOSIS — R0789 Other chest pain: Secondary | ICD-10-CM

## 2019-10-05 LAB — MYOCARDIAL PERFUSION IMAGING
LV dias vol: 67 mL (ref 46–106)
LV sys vol: 24 mL
Peak HR: 100 {beats}/min
Rest HR: 78 {beats}/min
SDS: 6
SRS: 5
SSS: 11
TID: 1.24

## 2019-10-05 MED ORDER — TECHNETIUM TC 99M TETROFOSMIN IV KIT
32.1000 | PACK | Freq: Once | INTRAVENOUS | Status: AC | PRN
  Administered 2019-10-05: 32.1 via INTRAVENOUS

## 2019-10-05 MED ORDER — TECHNETIUM TC 99M TETROFOSMIN IV KIT
10.8000 | PACK | Freq: Once | INTRAVENOUS | Status: AC | PRN
  Administered 2019-10-05: 10.8 via INTRAVENOUS

## 2019-10-05 MED ORDER — REGADENOSON 0.4 MG/5ML IV SOLN
0.4000 mg | Freq: Once | INTRAVENOUS | Status: AC
  Administered 2019-10-05: 0.4 mg via INTRAVENOUS

## 2019-10-18 ENCOUNTER — Encounter: Payer: Self-pay | Admitting: Cardiology

## 2019-10-18 ENCOUNTER — Ambulatory Visit (INDEPENDENT_AMBULATORY_CARE_PROVIDER_SITE_OTHER): Payer: Medicare PPO | Admitting: Cardiology

## 2019-10-18 ENCOUNTER — Other Ambulatory Visit: Payer: Self-pay

## 2019-10-18 VITALS — BP 144/100 | HR 88 | Ht 66.0 in | Wt 248.0 lb

## 2019-10-18 DIAGNOSIS — R0789 Other chest pain: Secondary | ICD-10-CM

## 2019-10-18 DIAGNOSIS — E782 Mixed hyperlipidemia: Secondary | ICD-10-CM

## 2019-10-18 DIAGNOSIS — E1140 Type 2 diabetes mellitus with diabetic neuropathy, unspecified: Secondary | ICD-10-CM | POA: Diagnosis not present

## 2019-10-18 DIAGNOSIS — I10 Essential (primary) hypertension: Secondary | ICD-10-CM

## 2019-10-18 NOTE — Progress Notes (Signed)
Cardiology Office Note:    Date:  10/18/2019   ID:  [name], DOB 1941-07-08, MRN [id number]  PCP:  Jamie Greenhouse, MD  Cardiologist:  Jamie Campus, MD    Referring MD: Jamie Greenhouse, MD   Chief Complaint  Patient presents with  . Follow-up    2 Months  #5  History of Present Illness:    [name] is a 79 y.o. female with past medical history significant for breast cancer, bilateral mastectomy done recently, diabetes, essential hypertension, dyslipidemia.  Comes today 2 months of follow-up.  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  Her echocardiogram has been also done which showed normal left ventricle ejection fraction without any segmental motion abnormalities.  Overall she is doing better.  She does have still some fatigue and tiredness, denies having any chest pain.  She complained vividly about unpleasant notes that she got during the stress test.  She has to keep her arm elevated for features and she did not like to get a lot of pain.  She is trying to be more active and walk a little bit home.  She also tried to walk outside but she is scared of going out.  Past Medical History:  Diagnosis Date  . Atypical chest pain 03/19/2017  . Cancer ([place])    breast, 2002  . Chronic kidney disease, stage 3 07/14/2016  . CKD (chronic kidney disease)    stage 3  . Diabetes mellitus without complication ([place])    type 2  . Gait disorder   . Hypercholesteremia   . Hypertension   . Morbid obesity with BMI of 40.0-44.9, adult ([place]) 10/27/2014  . OSA on CPAP   . Osteoarthrosis   . Recurrent major depressive disorder, in remission ([place]) 01/11/2016  . Urinary incontinence     Past Surgical History:  Procedure Laterality Date  . ABDOMINAL HYSTERECTOMY  1976   total  . BREAST LUMPECTOMY Right 2002   chemo, radiation  . CARPAL TUNNEL RELEASE Right    . CATARACT EXTRACTION, BILATERAL  2018  . FOOT TENDON SURGERY Right 1990  . REPLACEMENT TOTAL KNEE     bilateral  . TONSILLECTOMY    . TOTAL SHOULDER REPLACEMENT Right   . WISDOM TOOTH EXTRACTION      Current Medications: Current Meds  Medication Sig  . amLODipine (NORVASC) 5 MG tablet Take 5 mg by mouth daily.  Marland Kitchen anastrozole (ARIMIDEX) 1 MG tablet Take 1 mg by mouth daily.  Marland Kitchen atorvastatin (LIPITOR) 80 MG tablet Take 80 mg by mouth daily.  . empagliflozin (JARDIANCE) 25 MG TABS tablet Take 25 mg by mouth daily.  . furosemide (LASIX) 40 MG tablet Take 40 mg by mouth daily.  Marland Kitchen glimepiride (AMARYL) 4 MG tablet   . levothyroxine (SYNTHROID, LEVOTHROID) 150 MCG tablet 150 mcg daily.  . metFORMIN (GLUCOPHAGE) 500 MG tablet Take 1,000 mg by mouth 2 (two) times daily.  . valsartan (DIOVAN) 160 MG tablet Take 160 mg by mouth 2 (two) times daily.      Allergies:   Metformin   Social History   Socioeconomic History  . Marital status: Married    Spouse name: [name]  . Number of children: 0  . Years of education: 83  . Highest education level: Not on file  Occupational History    Comment: retired  Tobacco Use  . Smoking status: Never Smoker  .  Smokeless tobacco: Never Used  Substance and Sexual Activity  . Alcohol use: No  . Drug use: No  . Sexual activity: Not on file  Other Topics Concern  . Not on file  Social History Narrative   Lives with husband   Caffeine - coffee, 20-3 cups, Diet Coke- 3, 12 oz daily   Social Determinants of Health   Financial Resource Strain:   . Difficulty of Paying Living Expenses:   Food Insecurity:   . Worried About Charity fundraiser in the Last Year:   . Arboriculturist in the Last Year:   Transportation Needs:   . Film/video editor (Medical):   Marland Kitchen Lack of Transportation (Non-Medical):   Physical Activity:   . Days of Exercise per Week:   . Minutes of Exercise per Session:   Stress:   . Feeling of Stress :   Social Connections:    . Frequency of Communication with Friends and Family:   . Frequency of Social Gatherings with Friends and Family:   . Attends Religious Services:   . Active Member of Clubs or Organizations:   . Attends Archivist Meetings:   Marland Kitchen Marital Status:      Family History: The patient's family history includes Cancer in her brother, sister, and another family member; Diabetes in her father; Heart attack in her brother; Heart failure in her father; Hypertension in her mother; Memory loss in her sister; Stroke in her brother and mother. ROS:   Please see the history of present illness.    All 14 point review of systems negative except as described per history of present illness  EKGs/Labs/Other Studies Reviewed:      Recent Labs: No results found for requested labs within last 8760 hours.  Recent Lipid Panel No results found for: CHOL, TRIG, HDL, CHOLHDL, VLDL, LDLCALC, LDLDIRECT  Physical Exam:    VS:  BP (!) 144/100   Pulse 88   Ht 5\' 6"  (1.676 m)   Wt 248 lb (112.5 kg)   SpO2 97%   BMI 40.03 kg/m     Wt Readings from Last 3 Encounters:  10/18/19 248 lb (112.5 kg)  10/05/19 246 lb (111.6 kg)  08/11/19 246 lb (111.6 kg)     GEN:  Well nourished, well developed in no acute distress HEENT: Normal NECK: No JVD; No carotid bruits LYMPHATICS: No lymphadenopathy CARDIAC: RRR, no murmurs, no rubs, no gallops RESPIRATORY:  Clear to auscultation without rales, wheezing or rhonchi  ABDOMEN: Soft, non-tender, non-distended MUSCULOSKELETAL:  No edema; No deformity  SKIN: Warm and dry LOWER EXTREMITIES: no swelling NEUROLOGIC:  Alert and oriented x 3 PSYCHIATRIC:  Normal affect   ASSESSMENT:    1. Essential hypertension   2. Atypical chest pain   3. Type 2 diabetes mellitus with diabetic neuropathy, without long-term current use of insulin ([place])   4. Hyperlipidemia, mixed    PLAN:    In order of problems listed above:  1. Essential hypertension blood pressure  elevated today, but she did not take her diuretic today.  I asked her to check her blood pressure on a regular basis at home and let me know if her blood pressure will go more than 140/90.  In the meantime we will continue present medications. 2. Atypical chest pain not related to exercise.  Denies having any recently.  The biggest problem is pain in the right shoulder this is the shoulder that she had surgery done in 2007.  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  I do not think reaching the point of cardiac catheterization is needed.  We will simply follow this conservatively.  Obviously, if you have more chest pain then we will need to address this more aggressively. 3. Type 2 diabetes follow-up excellently by the primary care physician. 4. Dyslipidemia with last LDL of 72.  She is already on high intensity statin which I will continue.  If her LDL will be higher than 72 then I will consider adding Zetia.   Medication Adjustments/Labs and Tests Ordered: Current medicines are reviewed at length with the patient today.  Concerns regarding medicines are outlined above.  No orders of the defined types were placed in this encounter.  Medication changes: No orders of the defined types were placed in this encounter.   Signed, Park Liter, MD, [facility] 10/18/2019 11:14 AM    [place]

## 2019-10-18 NOTE — Patient Instructions (Signed)
Medication Instructions:  No medication changes. *If you need a refill on your cardiac medications before your next appointment, please call your pharmacy*   Lab Work: None ordered If you have labs (blood work) drawn today and your tests are completely normal, you will receive your results only by: . MyChart Message (if you have MyChart) OR . A paper copy in the mail If you have any lab test that is abnormal or we need to change your treatment, we will call you to review the results.   Testing/Procedures: None ordered   Follow-Up: At [facility], you and your health needs are our priority.  As part of our continuing mission to provide you with exceptional heart care, we have created designated Provider Care Teams.  These Care Teams include your primary Cardiologist (physician) and Advanced Practice Providers (APPs -  Physician Assistants and Nurse Practitioners) who all work together to provide you with the care you need, when you need it.  We recommend signing up for the patient portal called "MyChart".  Sign up information is provided on this After Visit Summary.  MyChart is used to connect with patients for Virtual Visits (Telemedicine).  Patients are able to view lab/test results, encounter notes, upcoming appointments, etc.  Non-urgent messages can be sent to your provider as well.   To learn more about what you can do with MyChart, go to [email or web address].    Your next appointment:   3 month(s)  The format for your next appointment:   In Person  Provider:   Robert Krasowski, MD   Other Instructions NA  

## 2019-11-16 DIAGNOSIS — F419 Anxiety disorder, unspecified: Secondary | ICD-10-CM | POA: Insufficient documentation

## 2019-12-03 DIAGNOSIS — C50111 Malignant neoplasm of central portion of right female breast: Secondary | ICD-10-CM

## 2020-01-18 ENCOUNTER — Encounter: Payer: Self-pay | Admitting: Cardiology

## 2020-01-18 ENCOUNTER — Ambulatory Visit (INDEPENDENT_AMBULATORY_CARE_PROVIDER_SITE_OTHER): Payer: Medicare PPO | Admitting: Cardiology

## 2020-01-18 ENCOUNTER — Other Ambulatory Visit: Payer: Self-pay

## 2020-01-18 VITALS — BP 116/70 | HR 98 | Ht 66.0 in | Wt 239.4 lb

## 2020-01-18 DIAGNOSIS — R9439 Abnormal result of other cardiovascular function study: Secondary | ICD-10-CM | POA: Diagnosis not present

## 2020-01-18 DIAGNOSIS — I10 Essential (primary) hypertension: Secondary | ICD-10-CM

## 2020-01-18 DIAGNOSIS — E1140 Type 2 diabetes mellitus with diabetic neuropathy, unspecified: Secondary | ICD-10-CM

## 2020-01-18 DIAGNOSIS — Z6841 Body Mass Index (BMI) 40.0 and over, adult: Secondary | ICD-10-CM

## 2020-01-18 DIAGNOSIS — E782 Mixed hyperlipidemia: Secondary | ICD-10-CM

## 2020-01-18 MED ORDER — ASPIRIN EC 81 MG PO TBEC: 81.0000 mg | DELAYED_RELEASE_TABLET | Freq: Every day | ORAL | 3 refills | Status: DC

## 2020-01-18 NOTE — Addendum Note (Signed)
Addended by: Linna Hoff R on: 01/18/2020 11:15 AM   Modules accepted: Orders

## 2020-01-18 NOTE — Progress Notes (Signed)
Cardiology Office Note:    Date:  01/18/2020   ID:  Beecher Mcardle [name], DOB 09/23/1940, MRN [id number]  PCP:  Algis Greenhouse, MD  Cardiologist:  Jenne Campus, MD    Referring MD: Algis Greenhouse, MD   No chief complaint on file. Doing fine  History of Present Illness:    [name] is a 79 y.o. female with past medical history significant for diabetes, essential hypertension, dyslipidemia, recently she had a stress test which showed minimal abnormalities.  She fibromyalgia the purpose of today's visit is to see how she does.  She tells me she is doing well but at the same time she tells me she does not do almost anything.  When asked her why it is that she told me straight because I am lazy.  Denies have any chest pain tightness squeezing pressure burning chest no splinting and tiredness.  Past Medical History:  Diagnosis Date  . Atypical chest pain 03/19/2017  . Cancer ([place])    breast, 2002  . Chronic kidney disease, stage 3 07/14/2016  . CKD (chronic kidney disease)    stage 3  . Diabetes mellitus without complication ([place])    type 2  . Gait disorder   . Hypercholesteremia   . Hypertension   . Morbid obesity with BMI of 40.0-44.9, adult ([place]) 10/27/2014  . OSA on CPAP   . Osteoarthrosis   . Recurrent major depressive disorder, in remission ([place]) 01/11/2016  . Urinary incontinence     Past Surgical History:  Procedure Laterality Date  . ABDOMINAL HYSTERECTOMY  1976   total  . BREAST LUMPECTOMY Right 2002   chemo, radiation  . CARPAL TUNNEL RELEASE Right   . CATARACT EXTRACTION, BILATERAL  2018  . FOOT TENDON SURGERY Right 1990  . REPLACEMENT TOTAL KNEE     bilateral  . TONSILLECTOMY    . TOTAL SHOULDER REPLACEMENT Right   . WISDOM TOOTH EXTRACTION      Current Medications: Current Meds  Medication Sig  . amLODipine (NORVASC) 5 MG tablet Take 5 mg by mouth daily.  Marland Kitchen anastrozole (ARIMIDEX) 1 MG tablet Take 1 mg by mouth daily.  Marland Kitchen  atorvastatin (LIPITOR) 80 MG tablet Take 80 mg by mouth daily.  . empagliflozin (JARDIANCE) 25 MG TABS tablet Take 25 mg by mouth daily.  . furosemide (LASIX) 40 MG tablet Take 40 mg by mouth daily.  Marland Kitchen glimepiride (AMARYL) 4 MG tablet   . levothyroxine (SYNTHROID, LEVOTHROID) 150 MCG tablet Take 150 mcg by mouth daily.   . metFORMIN (GLUCOPHAGE) 500 MG tablet Take 1,000 mg by mouth 2 (two) times daily.  . valsartan (DIOVAN) 160 MG tablet Take 160 mg by mouth 2 (two) times daily.      Allergies:   Metformin   Social History   Socioeconomic History  . Marital status: Married    Spouse name: Linsey  . Number of children: 0  . Years of education: 9  . Highest education level: Not on file  Occupational History    Comment: retired  Tobacco Use  . Smoking status: Never Smoker  . Smokeless tobacco: Never Used  Vaping Use  . Vaping Use: Never used  Substance and Sexual Activity  . Alcohol use: No  . Drug use: No  . Sexual activity: Not on file  Other Topics Concern  . Not on file  Social History Narrative   Lives with husband   Caffeine - coffee, 20-3 cups, Diet Coke- 3, 12  oz daily   Social Determinants of Health   Financial Resource Strain:   . Difficulty of Paying Living Expenses:   Food Insecurity:   . Worried About Charity fundraiser in the Last Year:   . Arboriculturist in the Last Year:   Transportation Needs:   . Film/video editor (Medical):   Marland Kitchen Lack of Transportation (Non-Medical):   Physical Activity:   . Days of Exercise per Week:   . Minutes of Exercise per Session:   Stress:   . Feeling of Stress :   Social Connections:   . Frequency of Communication with Friends and Family:   . Frequency of Social Gatherings with Friends and Family:   . Attends Religious Services:   . Active Member of Clubs or Organizations:   . Attends Archivist Meetings:   Marland Kitchen Marital Status:      Family History: The patient's family history includes Cancer in her  brother, sister, and another family member; Diabetes in her father; Heart attack in her brother; Heart failure in her father; Hypertension in her mother; Memory loss in her sister; Stroke in her brother and mother. ROS:   Please see the history of present illness.    All 14 point review of systems negative except as described per history of present illness  EKGs/Labs/Other Studies Reviewed:      Recent Labs: No results found for requested labs within last 8760 hours.  Recent Lipid Panel No results found for: CHOL, TRIG, HDL, CHOLHDL, VLDL, LDLCALC, LDLDIRECT  Physical Exam:    VS:  BP 116/70 (BP Location: Left Arm, Patient Position: Sitting, Cuff Size: Normal)   Pulse 98   Ht 5\' 6"  (1.676 m)   Wt 239 lb 6.4 oz (108.6 kg)   SpO2 96%   BMI 38.64 kg/m     Wt Readings from Last 3 Encounters:  01/18/20 239 lb 6.4 oz (108.6 kg)  10/18/19 248 lb (112.5 kg)  10/05/19 246 lb (111.6 kg)     GEN:  Well nourished, well developed in no acute distress HEENT: Normal NECK: No JVD; No carotid bruits LYMPHATICS: No lymphadenopathy CARDIAC: RRR, no murmurs, no rubs, no gallops RESPIRATORY:  Clear to auscultation without rales, wheezing or rhonchi  ABDOMEN: Soft, non-tender, non-distended MUSCULOSKELETAL:  No edema; No deformity  SKIN: Warm and dry LOWER EXTREMITIES: no swelling NEUROLOGIC:  Alert and oriented x 3 PSYCHIATRIC:  Normal affect   ASSESSMENT:    1. Essential hypertension   2. Abnormal stress test   3. Type 2 diabetes mellitus with diabetic neuropathy, without long-term current use of insulin ([place])   4. Morbid obesity with BMI of 40.0-44.9, adult ([place])   5. Hyperlipidemia, mixed    PLAN:    In order of problems listed above:  1. Essential hypertension blood pressure 116/70 today well controlled continue present management. 2. 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  We discussed option for this situation again including potentially doing cardiac catheterization she told me straight she does not want to have a cardiac catheterization.  She favor medical management.  I did review her medication with her.  She is not taking aspirin or asked him to start taking 1 baby aspirin every single day. 3. Obesity: Obviously a problem she understands she try to lose some weight. 4. Dyslipidemia: She is taking 80 mg of Lipitor which  is high intense statin which I will continue.  I did review K PN and care everywhere.  I was able to find positive the profile from February of this year showing HDL of 39 and LDL of 72.  This is acceptable cholesterol profile we will continue present management. 5. Diabetes: Last hemoglobin A1c from care everywhere is at 7.3 which is elevated.  She understands that she demonstrated that this is because she does not need tried.  Obviously I strongly advised her to improve her diet.   Medication Adjustments/Labs and Tests Ordered: Current medicines are reviewed at length with the patient today.  Concerns regarding medicines are outlined above.  No orders of the defined types were placed in this encounter.  Medication changes: No orders of the defined types were placed in this encounter.   Signed, Park Liter, MD, [facility] 01/18/2020 11:07 AM    [place]

## 2020-01-18 NOTE — Patient Instructions (Signed)
Medication Instructions:  Your physician has recommended you make the following change in your medication:   START: aspirin 81 mg daily   *If you need a refill on your cardiac medications before your next appointment, please call your pharmacy*   Lab Work: None. If you have labs (blood work) drawn today and your tests are completely normal, you will receive your results only by: Marland Kitchen MyChart Message (if you have MyChart) OR . A paper copy in the mail If you have any lab test that is abnormal or we need to change your treatment, we will call you to review the results.   Testing/Procedures: None.    Follow-Up: At [facility], you and your health needs are our priority.  As part of our continuing mission to provide you with exceptional heart care, we have created designated Provider Care Teams.  These Care Teams include your primary Cardiologist (physician) and Advanced Practice Providers (APPs -  Physician Assistants and Nurse Practitioners) who all work together to provide you with the care you need, when you need it.  We recommend signing up for the patient portal called "MyChart".  Sign up information is provided on this After Visit Summary.  MyChart is used to connect with patients for Virtual Visits (Telemedicine).  Patients are able to view lab/test results, encounter notes, upcoming appointments, etc.  Non-urgent messages can be sent to your provider as well.   To learn more about what you can do with MyChart, go to [email or web address].    Your next appointment:   4 month(s)  The format for your next appointment:   In Person  Provider:   Jenne Campus, MD   Other Instructions

## 2020-02-01 ENCOUNTER — Ambulatory Visit: Payer: Medicare Other | Admitting: Adult Health

## 2020-06-06 ENCOUNTER — Ambulatory Visit (INDEPENDENT_AMBULATORY_CARE_PROVIDER_SITE_OTHER): Payer: Medicare PPO | Admitting: Cardiology

## 2020-06-06 ENCOUNTER — Encounter: Payer: Self-pay | Admitting: Cardiology

## 2020-06-06 ENCOUNTER — Other Ambulatory Visit: Payer: Self-pay

## 2020-06-06 VITALS — BP 126/84 | HR 83 | Wt 239.0 lb

## 2020-06-06 DIAGNOSIS — E1140 Type 2 diabetes mellitus with diabetic neuropathy, unspecified: Secondary | ICD-10-CM | POA: Diagnosis not present

## 2020-06-06 DIAGNOSIS — E782 Mixed hyperlipidemia: Secondary | ICD-10-CM

## 2020-06-06 DIAGNOSIS — R9439 Abnormal result of other cardiovascular function study: Secondary | ICD-10-CM

## 2020-06-06 DIAGNOSIS — I10 Essential (primary) hypertension: Secondary | ICD-10-CM

## 2020-06-06 NOTE — Patient Instructions (Signed)

## 2020-06-06 NOTE — Progress Notes (Signed)
Cardiology Office Note:    Date:  06/06/2020   ID:  [name], DOB [birth year], MRN [id number]  PCP:  Algis Greenhouse, MD  Cardiologist:  Jenne Campus, MD    Referring MD: Algis Greenhouse, MD   Chief Complaint  Patient presents with  . Follow-up  , More about my husband  History of Present Illness:    [name] is a 79 y.o. 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  Likely she is asymptomatic, she denies have any chest pain tightness squeezing pressure burning chest.  She admits that she is living very sedentary lifestyle.  She sits majority of time does not do much she still take care of activities of daily living but overall not much.  Past Medical History:  Diagnosis Date  . Atypical chest pain 03/19/2017  . Cancer ([place])    breast, 2002  . Chronic kidney disease, stage 3 ([place]) 07/14/2016  . CKD (chronic kidney disease)    stage 3  . Diabetes mellitus without complication ([place])    type 2  . Gait disorder   . Hypercholesteremia   . Hypertension   . Morbid obesity with BMI of 40.0-44.9, adult ([place]) 10/27/2014  . OSA on CPAP   . Osteoarthrosis   . Recurrent major depressive disorder, in remission ([place]) 01/11/2016  . Urinary incontinence     Past Surgical History:  Procedure Laterality Date  . ABDOMINAL HYSTERECTOMY  1976   total  . BREAST LUMPECTOMY Right 2002   chemo, radiation  . CARPAL TUNNEL RELEASE Right   . CATARACT EXTRACTION, BILATERAL  2018  . FOOT TENDON SURGERY Right 1990  . REPLACEMENT TOTAL KNEE     bilateral  . TONSILLECTOMY    . TOTAL SHOULDER REPLACEMENT Right   . WISDOM TOOTH EXTRACTION      Current Medications: Current Meds  Medication Sig   . amLODipine (NORVASC) 5 MG tablet Take 5 mg by mouth daily.  [name] anastrozole (ARIMIDEX) 1 MG tablet Take 1 mg by mouth daily.  [name] aspirin EC 81 MG tablet Take 1 tablet (81 mg total) by mouth daily. Swallow whole.  [name] atorvastatin (LIPITOR) 80 MG tablet Take 80 mg by mouth daily.  . empagliflozin (JARDIANCE) 25 MG TABS tablet Take 25 mg by mouth daily.  . furosemide (LASIX) 40 MG tablet Take 40 mg by mouth daily.  [name] glimepiride (AMARYL) 4 MG tablet   . levothyroxine (SYNTHROID, LEVOTHROID) 150 MCG tablet Take 150 mcg by mouth daily.   . metFORMIN (GLUCOPHAGE) 500 MG tablet Take 1,000 mg by mouth 2 (two) times daily.  . valsartan (DIOVAN) 160 MG tablet Take 160 mg by mouth 2 (two) times daily.      Allergies:   Metformin   Social History   Socioeconomic History  . Marital status: Married    Spouse name: [name]  . Number of children: 0  . Years of education: 42  . Highest education level: Not on file  Occupational History    Comment: retired  Tobacco Use  . Smoking status: Never Smoker  . Smokeless tobacco: Never Used  Vaping Use  . Vaping Use: Never used  Substance and Sexual Activity  . Alcohol use: No  . Drug use: No  . Sexual activity: Not on file  Other Topics Concern  . Not on file  Social History Narrative   Lives with husband   Caffeine - coffee, 20-3 cups, Diet Coke- 3, 12 oz daily   Social Determinants of Health   Financial Resource Strain:   . Difficulty of Paying Living Expenses: Not on file  Food Insecurity:   . Worried About Charity fundraiser in the Last Year: Not on file  . Ran Out of Food in the Last Year: Not on file  Transportation Needs:   . Lack of Transportation (Medical): Not on file  . Lack of Transportation (Non-Medical): Not on file  Physical Activity:   . Days of Exercise per Week: Not on file  . Minutes of Exercise per Session: Not on file  Stress:   . Feeling of Stress : Not on file  Social Connections:   . Frequency of Communication  with Friends and Family: Not on file  . Frequency of Social Gatherings with Friends and Family: Not on file  . Attends Religious Services: Not on file  . Active Member of Clubs or Organizations: Not on file  . Attends Archivist Meetings: Not on file  . Marital Status: Not on file     Family History: The patient's family history includes Cancer in her brother, sister, and another family member; Diabetes in her father; Heart attack in her brother; Heart failure in her father; Hypertension in her mother; Memory loss in her sister; Stroke in her brother and mother. ROS:   Please see the history of present illness.    All 14 point review of systems negative except as described per history of present illness  EKGs/Labs/Other Studies Reviewed:      Recent Labs: No results found for requested labs within last 8760 hours.  Recent Lipid Panel No results found for: CHOL, TRIG, HDL, CHOLHDL, VLDL, LDLCALC, LDLDIRECT  Physical Exam:    VS:  BP 126/84 (BP Location: Left Arm, Patient Position: Sitting)   Pulse 83   Wt 239 lb (108.4 kg)   SpO2 95%   BMI 38.58 kg/m     Wt Readings from Last 3 Encounters:  06/06/20 239 lb (108.4 kg)  01/18/20 239 lb 6.4 oz (108.6 kg)  10/18/19 248 lb (112.5 kg)     GEN:  Well nourished, well developed in no acute distress HEENT: Normal NECK: No JVD; No carotid bruits LYMPHATICS: No lymphadenopathy CARDIAC: RRR, no murmurs, no rubs, no gallops RESPIRATORY:  Clear to auscultation without rales, wheezing or rhonchi  ABDOMEN: Soft, non-tender, non-distended MUSCULOSKELETAL:  No edema; No deformity  SKIN: Warm and dry LOWER EXTREMITIES: no swelling NEUROLOGIC:  Alert and oriented x 3 PSYCHIATRIC:  Normal affect   ASSESSMENT:    1. Essential hypertension   2. Abnormal stress test   3. Type 2 diabetes mellitus with diabetic neuropathy, without long-term current use of insulin ([place])   4. Hyperlipidemia, mixed    PLAN:    In order of  problems listed above:  1. Essential hypertension blood pressure seems to be well controlled: I will continue present management. 2. Abnormal stress test, lady is asymptomatic.  I will give her prescription for nitroglycerin just in case she start developing chest pain.  Also ask her to let me know if she develop chest pain. 3. Type 2 diabetes: Stable followed by internal medicine team: I do not have any latest hemoglobin A1c 4. Dyslipidemia: I did review her lab work test done by primary care physician her cholesterol from  March 13, 2020, show LDL of 61 and HDL of 31, that is on Lipitor 80 which I will continue.   Medication Adjustments/Labs and Tests Ordered: Current medicines are reviewed at length with the patient today.  Concerns regarding medicines are outlined above.  Orders Placed This Encounter  Procedures  . EKG 12-Lead   Medication changes: No orders of the defined types were placed in this encounter.   Signed, Park Liter, MD, [facility] 06/06/2020 11:08 AM    [place]

## 2020-06-23 ENCOUNTER — Inpatient Hospital Stay: Payer: Medicare PPO | Admitting: Oncology

## 2020-08-02 NOTE — Progress Notes (Signed)
[place] [facility]  [address] [place],  [place]  [address] [phone]  Clinic Day:  08/03/2020  Referring physician: Algis Greenhouse, MD   This document serves as a record of services personally performed by Hosie Poisson, MD. It was created on their behalf by Curry,Lauren E, a trained medical scribe. The creation of this record is based on the scribe's personal observations and the provider's statements to them.   CHIEF COMPLAINT:  CC: Stage IA hormone receptor positive right breast cancer  Current Treatment:  Anastrozole daily for a total of 5 years   HISTORY OF PRESENT ILLNESS:  [name] is a 80 y.o. 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 She was treated with lumpectomy.  Pathology revealed a 1.8 cm, high grade, infiltrating ductal carcinoma.  Sentinel node was negative for metastasis.  Estrogen and progesterone receptors were negative and her 2 Neu negative.  She received adjuvant chemotherapy with Adriamycin and Cytoxan for 4 cycles, followed by postoperative radiation to the right breast.  She has had bilateral knee replacements and a right shoulder replacement.  Due to her personal and family history of cancer, we recommended testing for hereditary breast and ovarian cancer, but she declined.  She was seen in June 2020 for follow up after her recent mammogram and biopsy.  The pathology revealed a 6 mm, grade 2, invasive ductal carcinoma.  Estrogen and progesterone receptors were highly positive.  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  She was referred her to Dr. Noberto Retort for right mastectomy.  She requested  bilateral mastectomies, as this was her second breast cancer.  She underwent bilateral mastectomies in July 2020.  Surgical pathology revealed a 9 mm invasive ductal carcinoma, grade 2, in her right breast.  One sentinel lymph node was tested and was negative  for metastatic carcinoma (0/1), for a stage IA (T1b N0 M0).  Estrogen and progesterone receptors were 90% positive and HER2 negative.  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  We recommended adjuvant endocrine therapy with anastrozole 1 mg daily for a total of 5 years, which was started in August 2020.  Bone density scan in August was normal.  Due to her personal and family history of malignancy, she underwent testing for hereditary breast cancer with the [facility] Hereditary Cancer panel test in July 2020.  This did not reveal any clinically significant mutation or variants of uncertain significance.  A nuclear medicine whole body bone scan was done in May 2021 and was clear.  There was periarticular uptake about both knees related to bilateral knee arthroplasty. There was increased uptake in the left shoulder, felt to be likely degenerative, as the patient has a history of right shoulder replacement. There was scoliotic curvature in the spine with mild degenerative type uptake throughout.  Dedicated views of the forearms demonstrate normal distribution of radiotracer. Both kidneys and urinary bladder are visualized.  INTERVAL HISTORY:  [name] is here for routine follow up and states that she has been well, but did have a fall yesterday.  She continues anastrozole daily without significant difficulty.  Sadly, her husband has been diagnosed with stage IV lung cancer in the fall of 2021.  This has caused her some anxiety along with some other situational issues.  She undergoes routine blood work at her primary care office.  Her  appetite is good, and she has  gained 2 pounds since her last visit.  She denies fever, chills or other signs of infection.  She denies nausea, vomiting, bowel issues, or abdominal pain.  She denies sore throat, cough, dyspnea, or chest pain.  REVIEW OF SYSTEMS:  Review of Systems  Constitutional: Negative.   HENT:  Negative.   Eyes: Negative.    Respiratory: Negative.   Cardiovascular: Negative.   Gastrointestinal: Negative.   Endocrine: Negative.   Genitourinary: Negative.    Musculoskeletal: Positive for gait problem (unsteadiness, she did have a fall yesterday and uses a walker to ambulate).  Skin: Negative.   Neurological: Positive for gait problem (unsteadiness, she did have a fall yesterday and uses a walker to ambulate).  Hematological: Negative.   Psychiatric/Behavioral: The patient is nervous/anxious (some).      VITALS:  Blood pressure (!) 161/91, pulse 90, temperature 98.5 F (36.9 C), temperature source Oral, resp. rate 18, height $RemoveBe'5\' 6"'EpsKAsdFD$  (1.676 m), weight 240 lb 12.8 oz (109.2 kg), SpO2 94 %.  Wt Readings from Last 3 Encounters:  08/03/20 240 lb 12.8 oz (109.2 kg)  06/06/20 239 lb (108.4 kg)  01/18/20 239 lb 6.4 oz (108.6 kg)    Body mass index is 38.87 kg/m.  Performance status (ECOG): 1 - Symptomatic but completely ambulatory  PHYSICAL EXAM:  Physical Exam Constitutional:      General: She is not in acute distress.    Appearance: Normal appearance. She is normal weight.  HENT:     Head: Normocephalic and atraumatic.  Eyes:     General: No scleral icterus.    Extraocular Movements: Extraocular movements intact.     Conjunctiva/sclera: Conjunctivae normal.     Pupils: Pupils are equal, round, and reactive to light.  Cardiovascular:     Rate and Rhythm: Normal rate and regular rhythm.     Pulses: Normal pulses.     Heart sounds: Normal heart sounds. No murmur heard. No friction rub. No gallop.   Pulmonary:     Effort: Pulmonary effort is normal. No respiratory distress.     Breath sounds: Normal breath sounds.  Chest:  Breasts:     Right: Absent.     Left: Absent.      Comments: Bilateral mastectomies are negative. Abdominal:     General: Bowel sounds are normal. There is no distension.     Palpations: Abdomen is soft. There is no mass.     Tenderness: There is no abdominal tenderness.   Musculoskeletal:        General: Normal range of motion.     Cervical back: Normal range of motion and neck supple.     Right lower leg: No edema.     Left lower leg: No edema.  Lymphadenopathy:     Cervical: No cervical adenopathy.  Skin:    General: Skin is warm and dry.  Neurological:     General: No focal deficit present.     Mental Status: She is alert and oriented to person, place, and time. Mental status is at baseline.  Psychiatric:        Mood and Affect: Mood normal.        Behavior: Behavior normal.        Thought Content: Thought content normal.        Judgment: Judgment normal.     LABS:   CBC Latest Ref Rng & Units 06/21/2009 06/20/2009 06/14/2009  WBC 4.0 - 10.5 K/uL 7.8 5.4 7.8  Hemoglobin 12.0 - 15.0 g/dL 11.3(L) 11.6(L) 14.1  Hematocrit 36.0 - 46.0 % 33.4(L) 33.9(L) 41.6  Platelets 150 - 400 K/uL 186 172 241   CMP Latest Ref Rng & Units 06/21/2009 06/20/2009 06/14/2009  Glucose 70 - 99 mg/dL 141(H) 122(H) 108(H)  BUN 6 - 23 mg/dL $Remove'11 13 19  'SNWgARS$ Creatinine 0.4 - 1.2 mg/dL 0.77 1.03 0.94  Sodium 135 - 145 mEq/L 140 140 144  Potassium 3.5 - 5.1 mEq/L 3.5 3.7 3.9  Chloride 96 - 112 mEq/L 102 105 104  CO2 19 - 32 mEq/L 32 27 32  Calcium 8.4 - 10.5 mg/dL 8.4 8.1(L) 9.2  Total Protein 6.0 - 8.3 g/dL - - 7.1  Total Bilirubin 0.3 - 1.2 mg/dL - - 0.3  Alkaline Phos 39 - 117 U/L - - 85  AST 0 - 37 U/L - - 20  ALT 0 - 35 U/L - - 23    STUDIES:  No results found.   Allergies:  Allergies  Allergen Reactions  . Metformin     Other reaction(s): Other (See Comments) dysegusia    Current Medications: Current Outpatient Medications  Medication Sig Dispense Refill  . ALPRAZolam (XANAX) 0.25 MG tablet Take by mouth.    . cycloSPORINE (RESTASIS) 0.05 % ophthalmic emulsion     . hydrOXYzine (ATARAX/VISTARIL) 10 MG tablet     . tolterodine (DETROL LA) 2 MG 24 hr capsule     . amLODipine (NORVASC) 5 MG tablet Take 5 mg by mouth daily.    Marland Kitchen anastrozole (ARIMIDEX) 1  MG tablet Take 1 mg by mouth daily.    Marland Kitchen aspirin EC 81 MG tablet Take 1 tablet (81 mg total) by mouth daily. Swallow whole. 90 tablet 3  . atorvastatin (LIPITOR) 80 MG tablet Take 80 mg by mouth daily.    . empagliflozin (JARDIANCE) 25 MG TABS tablet Take 25 mg by mouth daily.    . furosemide (LASIX) 40 MG tablet Take 40 mg by mouth daily.    Marland Kitchen glimepiride (AMARYL) 4 MG tablet     . levothyroxine (SYNTHROID, LEVOTHROID) 150 MCG tablet Take 150 mcg by mouth daily.     . metFORMIN (GLUCOPHAGE) 500 MG tablet Take 1,000 mg by mouth 2 (two) times daily.    . traZODone (DESYREL) 50 MG tablet     . valsartan (DIOVAN) 160 MG tablet Take 160 mg by mouth 2 (two) times daily.      No current facility-administered medications for this visit.     ASSESSMENT & PLAN:   Assessment:   1. Remote history of stage IA hormone receptor positive breast cancer diagnosed in June 2002, with no evidence of disease nearly 19 years postop.  2. Stage IA hormone receptor positive right breast, treated with bilateral mastectomies in July 2020.  She does not require chemotherapy or radiation.  She is on adjuvant hormonal therapy with anastrozole 1 mg daily and we recommend a total of 5 years of therapy.  3. Personal and family history of malignancy.  Genetic testing did not reveal any clinically significant mutations or variants of uncertain significance.    4. Paresthesias and balance difficulties, likely due to diabetic neuropathy.  I also offered her physical therapy for further evaluation and treatment, but she declined.  I recommended that she continue to follow-up with Dr. Garlon Hatchet.  Plan: She knows to continue anastrozole daily.  We will plan to see her back 6 months for reexamination.  The patient understands the plans discussed today and is in agreement with them.  She knows to contact  our office if she develops concerns regarding her breast cancer or its treatment.   I provided 20 minutes of face-to-face time  during this this encounter and > 50% was spent counseling as documented under my assessment and plan.    Derwood Kaplan, MD [facility] [address] [place] [address] Dept: [phone] Dept Fax: [phone]   I, Rita Ohara, am acting as scribe for Derwood Kaplan, MD  I have reviewed this report as typed by the medical scribe, and it is complete and accurate.

## 2020-08-03 ENCOUNTER — Encounter: Payer: Self-pay | Admitting: Oncology

## 2020-08-03 ENCOUNTER — Inpatient Hospital Stay: Payer: Medicare PPO | Attending: Oncology | Admitting: Oncology

## 2020-08-03 ENCOUNTER — Other Ambulatory Visit: Payer: Self-pay

## 2020-08-03 VITALS — BP 161/91 | HR 90 | Temp 98.5°F | Resp 18 | Ht 66.0 in | Wt 240.8 lb

## 2020-08-03 DIAGNOSIS — C50411 Malignant neoplasm of upper-outer quadrant of right female breast: Secondary | ICD-10-CM

## 2020-08-03 DIAGNOSIS — Z170 Estrogen receptor positive status [ER+]: Secondary | ICD-10-CM | POA: Diagnosis not present

## 2020-11-12 ENCOUNTER — Other Ambulatory Visit: Payer: Self-pay | Admitting: Oncology

## 2021-01-23 ENCOUNTER — Other Ambulatory Visit: Payer: Self-pay

## 2021-01-23 DIAGNOSIS — E119 Type 2 diabetes mellitus without complications: Secondary | ICD-10-CM | POA: Insufficient documentation

## 2021-01-23 DIAGNOSIS — M1990 Unspecified osteoarthritis, unspecified site: Secondary | ICD-10-CM | POA: Insufficient documentation

## 2021-01-23 DIAGNOSIS — E7800 Pure hypercholesterolemia, unspecified: Secondary | ICD-10-CM | POA: Insufficient documentation

## 2021-01-23 DIAGNOSIS — R32 Unspecified urinary incontinence: Secondary | ICD-10-CM | POA: Insufficient documentation

## 2021-01-24 ENCOUNTER — Other Ambulatory Visit: Payer: Self-pay

## 2021-01-24 ENCOUNTER — Encounter: Payer: Self-pay | Admitting: Cardiology

## 2021-01-24 ENCOUNTER — Ambulatory Visit (INDEPENDENT_AMBULATORY_CARE_PROVIDER_SITE_OTHER): Payer: Medicare PPO | Admitting: Cardiology

## 2021-01-24 VITALS — BP 108/80 | HR 92 | Ht 66.0 in | Wt 240.8 lb

## 2021-01-24 DIAGNOSIS — I10 Essential (primary) hypertension: Secondary | ICD-10-CM

## 2021-01-24 DIAGNOSIS — E782 Mixed hyperlipidemia: Secondary | ICD-10-CM | POA: Diagnosis not present

## 2021-01-24 DIAGNOSIS — R9439 Abnormal result of other cardiovascular function study: Secondary | ICD-10-CM

## 2021-01-24 NOTE — Patient Instructions (Signed)
Medication Instructions:  Your physician recommends that you continue on your current medications as directed. Please refer to the Current Medication list given to you today.  *If you need a refill on your cardiac medications before your next appointment, please call your pharmacy*   Lab Work:  If you have labs (blood work) drawn today and your tests are completely normal, you will receive your results only by: [facility] (if you have MyChart) OR A paper copy in the mail If you have any lab test that is abnormal or we need to change your treatment, we will call you to review the results.   Testing/Procedures:    Follow-Up: At [facility], you and your health needs are our priority.  As part of our continuing mission to provide you with exceptional heart care, we have created designated Provider Care Teams.  These Care Teams include your primary Cardiologist (physician) and Advanced Practice Providers (APPs -  Physician Assistants and Nurse Practitioners) who all work together to provide you with the care you need, when you need it.  We recommend signing up for the patient portal called "MyChart".  Sign up information is provided on this After Visit Summary.  MyChart is used to connect with patients for Virtual Visits (Telemedicine).  Patients are able to view lab/test results, encounter notes, upcoming appointments, etc.  Non-urgent messages can be sent to your provider as well.   To learn more about what you can do with MyChart, go to [email or web address].    Your next appointment:   6 month(s)  The format for your next appointment:   In Person  Provider:   Jenne Campus, MD   Other Instructions

## 2021-01-24 NOTE — Progress Notes (Signed)
Cardiology Office Note:    Date:  01/24/2021   ID:  [name], DOB 1941-04-21, MRN [id number]  PCP:  Algis Greenhouse, MD  Cardiologist:  Jenne Campus, MD    Referring MD: Algis Greenhouse, MD   Chief Complaint  Patient presents with   Stress    History of Present Illness:    [name] is a 80 y.o. female with past medical history significant for essential hypertension, diabetes, dyslipidemia.  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  After that echocardiogram being done which showed preserved left ventricle ejection fraction without segmental wall motion abnormalities.  Overall she is completely asymptomatic.  She comes today to my office she is very sad.  Her husband have of stage IV lung cancer.  He initially was started with chemotherapy however unable to tolerate this because of low platelets/blood count.  That therapy has been stopped and he is not being treated right now she is crying actually in the office.  She says she is busy taking of her husband.  Denies have any chest pain tightness squeezing pressure burning chest some fatigue is present.  Past Medical History:  Diagnosis Date   Atypical chest pain 03/19/2017   Cancer [facility])    breast, 2002   Chronic kidney disease, stage 3 ([place]) 07/14/2016   CKD (chronic kidney disease)    stage 3   Diabetes mellitus without complication ([place])    type 2   Gait disorder    History of breast cancer 07/14/2016   Hypercholesteremia    Hypertension    Morbid obesity with BMI of 40.0-44.9, adult ([place]) 10/27/2014   OSA on CPAP    Osteoarthrosis    Recurrent major depressive disorder, in remission ([place]) 01/11/2016   Urinary incontinence     Past Surgical History:  Procedure Laterality Date   ABDOMINAL HYSTERECTOMY  1976   total   BREAST LUMPECTOMY Right 2002   chemo, radiation   CARPAL TUNNEL RELEASE Right    CATARACT  EXTRACTION, BILATERAL  2018   FOOT TENDON SURGERY Right 1990   REPLACEMENT TOTAL KNEE     bilateral   TONSILLECTOMY     TOTAL SHOULDER REPLACEMENT Right    WISDOM TOOTH EXTRACTION      Current Medications: Current Meds  Medication Sig   ALPRAZolam (XANAX) 0.25 MG tablet Take 0.25 mg by mouth at bedtime as needed for anxiety.   amLODipine (NORVASC) 5 MG tablet Take 5 mg by mouth daily.   amoxicillin (AMOXIL) 500 MG capsule Take 1 capsule by mouth daily.   anastrozole (ARIMIDEX) 1 MG tablet TAKE ONE TABLET BY MOUTH EVERY DAY (Patient taking differently: Take 1 mg by mouth daily.)   atorvastatin (LIPITOR) 80 MG tablet Take 80 mg by mouth daily.   empagliflozin (JARDIANCE) 25 MG TABS tablet Take 25 mg by mouth daily.   fluconazole (DIFLUCAN) 100 MG tablet Take 1 tablet by mouth as needed (sclerosis).   furosemide (LASIX) 40 MG tablet Take 40 mg by mouth daily.   glimepiride (AMARYL) 4 MG tablet Take 4 mg by mouth daily with breakfast.   levothyroxine (SYNTHROID, LEVOTHROID) 150 MCG tablet Take 150 mcg by mouth daily.    metFORMIN (GLUCOPHAGE) 500 MG tablet Take 1,000 mg by mouth 2 (two) times daily.   traZODone (DESYREL) 50 MG tablet Take 50 mg by mouth at bedtime as needed for sleep.   valsartan (DIOVAN) 160 MG  tablet Take 160 mg by mouth 2 (two) times daily.      Allergies:   Metformin   Social History   Socioeconomic History   Marital status: Married    Spouse name: Linsey   Number of children: 0   Years of education: 18   Highest education level: Not on file  Occupational History    Comment: retired  Tobacco Use   Smoking status: Never   Smokeless tobacco: Never  Vaping Use   Vaping Use: Never used  Substance and Sexual Activity   Alcohol use: No   Drug use: No   Sexual activity: Not on file  Other Topics Concern   Not on file  Social History Narrative   Lives with husband   Caffeine - coffee, 20-3 cups, Diet Coke- 3, 12 oz daily   Social Determinants of Health    Financial Resource Strain: Not on file  Food Insecurity: Not on file  Transportation Needs: Not on file  Physical Activity: Not on file  Stress: Not on file  Social Connections: Not on file     Family History: The patient's family history includes Cancer in her brother, sister, and another family member; Diabetes in her father; Heart attack in her brother; Heart failure in her father; Hypertension in her mother; Memory loss in her sister; Stroke in her brother and mother. ROS:   Please see the history of present illness.    All 14 point review of systems negative except as described per history of present illness  EKGs/Labs/Other Studies Reviewed:      Recent Labs: No results found for requested labs within last 8760 hours.  Recent Lipid Panel No results found for: CHOL, TRIG, HDL, CHOLHDL, VLDL, LDLCALC, LDLDIRECT  Physical Exam:    VS:  BP 108/80 (BP Location: Left Arm, Patient Position: Sitting)   Pulse 92   Ht 5\' 6"  (1.676 m)   Wt 240 lb 12.8 oz (109.2 kg)   SpO2 96%   BMI 38.87 kg/m     Wt Readings from Last 3 Encounters:  01/24/21 240 lb 12.8 oz (109.2 kg)  08/03/20 240 lb 12.8 oz (109.2 kg)  06/06/20 239 lb (108.4 kg)     GEN:  Well nourished, well developed in no acute distress HEENT: Normal NECK: No JVD; No carotid bruits LYMPHATICS: No lymphadenopathy CARDIAC: RRR, no murmurs, no rubs, no gallops RESPIRATORY:  Clear to auscultation without rales, wheezing or rhonchi  ABDOMEN: Soft, non-tender, non-distended MUSCULOSKELETAL:  No edema; No deformity  SKIN: Warm and dry LOWER EXTREMITIES: no swelling NEUROLOGIC:  Alert and oriented x 3 PSYCHIATRIC:  Normal affect   ASSESSMENT:    1. Essential hypertension   2. Hyperlipidemia, mixed   3. Abnormal stress test    PLAN:    In order of problems listed above:  Abnormal stress test however patient is completely asymptomatic.  We will continue present conservative approach as per her wishes.    Essential hypertension: Blood pressure seems to well controlled continue present management. Dyslipidemia: She is on Lipitor 80 which is high intensity statin which I will continue.  I did review fasting lipid profile done by primary care physician in February showing LDL of 56 HDL 41 this is a good cholesterol profile we will continue present management.   Medication Adjustments/Labs and Tests Ordered: Current medicines are reviewed at length with the patient today.  Concerns regarding medicines are outlined above.  No orders of the defined types were placed in this encounter.  Medication changes: No orders of the defined types were placed in this encounter.   Signed, Park Liter, MD, [facility] 01/24/2021 11:11 AM    [place]

## 2021-01-30 NOTE — Progress Notes (Signed)
[facility]  [address] [place],  [place]  [address] [phone]  Clinic Day:  01/31/2021  Referring physician: Algis Greenhouse, MD   CHIEF COMPLAINT:  CC: Stage IA hormone receptor positive breast cancer  Current Treatment:  Anastrozole 51m daily  HISTORY OF PRESENT ILLNESS:  [name] a 80y.o. 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 She was treated with lumpectomy.  Pathology revealed a 1.8 cm, high grade, infiltrating ductal carcinoma.  Sentinel node was negative for metastasis.  Estrogen and progesterone receptors were negative and her 2 Neu negative.  She received adjuvant chemotherapy with Adriamycin and Cytoxan for 4 cycles, followed by postoperative radiation to the right breast.  Due to her personal and family history of cancer, we recommended testing for hereditary breast and ovarian cancer, but she declined.  She was found to have a new stage IA (Tlb N0 M0) hormone receptor positive right breast cancer in June 2020. Routine mammogram revealed a mass int the right breast.  Biopsy revealed grade 2, invasive ductal carcinoma.  Estrogen and progesterone receptors were highly positive.  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  She was referred her to Dr. LNoberto Retortfor right mastectomy.  She requested  bilateral mastectomies, as this was her second breast cancer.  She underwent bilateral mastectomies in July 2020.  Surgical pathology revealed a 9 mm invasive ductal carcinoma, grade 2, in her right breast.  One sentinel lymph node was tested and was negative for metastatic carcinoma (0/1).  Estrogen and progesterone receptors were 90% positive and HER2 negative.  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  We recommended adjuvant endocrine therapy with anastrozole 1 mg daily for a total of 5 years, which was started in August 2020.   Bone density scan in August was normal.  She underwent testing for hereditary breast cancer with [facility] in July 2020.  This did not reveal any clinically significant mutation or variants of uncertain significance.  Bone scan in May 2021 did not reveal any evidence of malignancy.  There was periarticular uptake about both knees related to bilateral knee arthroplasty. There was increased uptake in the left shoulder, felt to be likely degenerative. She has a history of right shoulder replacement. There was scoliotic curvature in the spine with mild degenerative type uptake throughout.  Dedicated views of the forearms demonstrate normal distribution of radiotracer. Both kidneys and urinary bladder are visualized.   At her last visit in January, she reported a fall. She has paresthesias and balance difficulties, felt to be likely due to diabetic neuropathy.  We offered to have her see physical therapy for further evaluation and treatment, but she declined.  INTERVAL HISTORY:  LToya Smothersis here today for repeat examination.  She states she continues to tolerate anastrozole without significant difficulty.  She states she may feel lymph node in the right axilla for the past 2 weeks, she did not contact our office to come in earlier, as she knew she would be seen today. She denies any changes in her mastectomy sites. She denies fevers or chills. She reports generalized arthralgias due to arthritis, but denies increased pain. Her appetite is good. Her weight has been stable.  REVIEW OF SYSTEMS:  Review of Systems  Constitutional:  Negative for appetite change, chills, fatigue, fever and unexpected weight change.  HENT:  Negative for lump/mass, mouth sores and sore throat.   Respiratory:  Negative for cough and shortness of breath.   Cardiovascular:  Negative for chest pain and leg swelling.  Gastrointestinal:  Negative for abdominal pain, constipation, diarrhea, nausea and  vomiting.  Endocrine: Negative for hot flashes.  Genitourinary:  Negative for difficulty urinating, dysuria, frequency and hematuria.   Musculoskeletal:  Positive for arthralgias (generalized, stable). Negative for back pain and myalgias.  Skin:  Negative for rash.  Neurological:  Negative for dizziness and headaches.  Hematological:  Positive for adenopathy (right axilla). Does not bruise/bleed easily.  Psychiatric/Behavioral:  Negative for depression and sleep disturbance. The patient is not nervous/anxious.     VITALS:  Blood pressure (!) 160/78, pulse 89, temperature 97.9 F (36.6 C), temperature source Oral, resp. rate 18, height _0  (1.676 m), weight 239 lb 8 oz (108.6 kg), SpO2 94 %.  Wt Readings from Last 3 Encounters:  01/31/21 239 lb 8 oz (108.6 kg)  01/24/21 240 lb 12.8 oz (109.2 kg)  08/03/20 240 lb 12.8 oz (109.2 kg)    Body mass index is 38.66 kg/m.  Performance status (ECOG): 0 - Asymptomatic  PHYSICAL EXAM:  Physical Exam Vitals and nursing note reviewed.  Constitutional:      General: She is not in acute distress.    Appearance: Normal appearance.  HENT:     Head: Normocephalic and atraumatic.     Mouth/Throat:     Mouth: Mucous membranes are moist.     Pharynx: Oropharynx is clear. No oropharyngeal exudate or posterior oropharyngeal erythema.  Eyes:     General: No scleral icterus.    Extraocular Movements: Extraocular movements intact.     Conjunctiva/sclera: Conjunctivae normal.     Pupils: Pupils are equal, round, and reactive to light.  Cardiovascular:     Rate and Rhythm: Normal rate and regular rhythm.     Heart sounds: Normal heart sounds. No murmur heard.   No friction rub. No gallop.  Pulmonary:     Effort: Pulmonary effort is normal.     Breath sounds: Normal breath sounds. No wheezing, rhonchi or rales.  Chest:  Breasts:    Right: Absent. Axillary adenopathy (firmness/swelling in the high right axilla, which is tender) present. No  supraclavicular adenopathy.     Left: Absent. No axillary adenopathy or supraclavicular adenopathy.     Comments: Bilateral mastectomy site is negative Abdominal:     General: There is no distension.     Palpations: Abdomen is soft. There is no hepatomegaly, splenomegaly or mass.     Tenderness: There is no abdominal tenderness.  Musculoskeletal:        General: Normal range of motion.     Cervical back: Normal range of motion and neck supple. No tenderness.     Right lower leg: No edema.     Left lower leg: No edema.  Lymphadenopathy:     Cervical: No cervical adenopathy.     Upper Body:     Right upper body: Axillary adenopathy (firmness/swelling in the high right axilla, which is tender) present. No supraclavicular adenopathy.     Left upper body: No supraclavicular or axillary adenopathy.     Lower Body: No right inguinal adenopathy. No left inguinal adenopathy.  Skin:    General: Skin is warm and dry.     Coloration: Skin is not jaundiced.     Findings: No rash.  Neurological:     Mental Status: She is alert and oriented to  person, place, and time.     Cranial Nerves: No cranial nerve deficit.  Psychiatric:        Mood and Affect: Mood normal.        Behavior: Behavior normal.        Thought Content: Thought content normal.    LABS:   CBC Latest Ref Rng & Units 06/21/2009 06/20/2009 06/14/2009  WBC 4.0 - 10.5 K/uL 7.8 5.4 7.8  Hemoglobin 12.0 - 15.0 g/dL 11.3(L) 11.6(L) 14.1  Hematocrit 36.0 - 46.0 % 33.4(L) 33.9(L) 41.6  Platelets 150 - 400 K/uL 186 172 241   CMP Latest Ref Rng & Units 06/21/2009 06/20/2009 06/14/2009  Glucose 70 - 99 mg/dL 141(H) 122(H) 108(H)  BUN 6 - 23 mg/dL _0 Creatinine 0.4 - 1.2 mg/dL 0.77 1.03 0.94  Sodium 135 - 145 mEq/L 140 140 144  Potassium 3.5 - 5.1 mEq/L 3.5 3.7 3.9  Chloride 96 - 112 mEq/L 102 105 104  CO2 19 - 32 mEq/L 32 27 32  Calcium 8.4 - 10.5 mg/dL 8.4 8.1(L) 9.2  Total Protein 6.0 - 8.3 g/dL - - 7.1  Total Bilirubin  0.3 - 1.2 mg/dL - - 0.3  Alkaline Phos 39 - 117 U/L - - 85  AST 0 - 37 U/L - - 20  ALT 0 - 35 U/L - - 23     No results found for: CEA1 / No results found for: CEA1 No results found for: PSA1 No results found for: MNO177 No results found for: NHA579  No results found for: TOTALPROTELP, ALBUMINELP, A1GS, A2GS, BETS, BETA2SER, GAMS, MSPIKE, SPEI No results found for: TIBC, FERRITIN, IRONPCTSAT No results found for: LDH  STUDIES:  No results found.    HISTORY:   Past Medical History:  Diagnosis Date   Atypical chest pain 03/19/2017   Cancer ([facility])    breast, 2002   Chronic kidney disease, stage 3 ([place]) 07/14/2016   CKD (chronic kidney disease)    stage 3   Diabetes mellitus without complication ([facility])    type 2   Gait disorder    History of breast cancer 07/14/2016   Hypercholesteremia    Hypertension    Morbid obesity with BMI of 40.0-44.9, adult ([place]) 10/27/2014   OSA on CPAP    Osteoarthrosis    Recurrent major depressive disorder, in remission ([place]) 01/11/2016   Urinary incontinence     Past Surgical History:  Procedure Laterality Date   ABDOMINAL HYSTERECTOMY  1976   total   BREAST LUMPECTOMY Right 2002   chemo, radiation   CARPAL TUNNEL RELEASE Right    CATARACT EXTRACTION, BILATERAL  2018   FOOT TENDON SURGERY Right 1990   REPLACEMENT TOTAL KNEE     bilateral   TONSILLECTOMY     TOTAL SHOULDER REPLACEMENT Right    WISDOM TOOTH EXTRACTION      Family History  Problem Relation Age of Onset   Stroke Mother    Hypertension Mother    Heart failure Father    Diabetes Father    Cancer Sister        breast   Memory loss Sister    Cancer Brother        larynx   Cancer Other        leukkemia, GYN cancer   Stroke Brother    Heart attack Brother     Social History:  reports that she has never smoked. She has never used smokeless tobacco. She reports that she does not  drink alcohol and does not use drugs.The patient is alone today.  Allergies:  Allergies   Allergen Reactions   Metformin     Other reaction(s): Other (See Comments) dysegusia    Current Medications: Current Outpatient Medications  Medication Sig Dispense Refill   ALPRAZolam (XANAX) 0.25 MG tablet Take 0.25 mg by mouth at bedtime as needed for anxiety.     amLODipine (NORVASC) 5 MG tablet Take 5 mg by mouth daily.     amoxicillin (AMOXIL) 500 MG capsule Take 1 capsule by mouth daily.     anastrozole (ARIMIDEX) 1 MG tablet TAKE ONE TABLET BY MOUTH EVERY DAY (Patient taking differently: Take 1 mg by mouth daily.) 90 tablet 5   aspirin EC 81 MG tablet Take 1 tablet (81 mg total) by mouth daily. Swallow whole. (Patient not taking: Reported on 01/24/2021) 90 tablet 3   atorvastatin (LIPITOR) 80 MG tablet Take 80 mg by mouth daily.     cycloSPORINE (RESTASIS) 0.05 % ophthalmic emulsion  (Patient not taking: Reported on 01/24/2021)     empagliflozin (JARDIANCE) 25 MG TABS tablet Take 25 mg by mouth daily.     fluconazole (DIFLUCAN) 100 MG tablet Take 1 tablet by mouth as needed (sclerosis).     furosemide (LASIX) 40 MG tablet Take 40 mg by mouth daily.     glimepiride (AMARYL) 4 MG tablet Take 4 mg by mouth daily with breakfast.     hydrOXYzine (ATARAX/VISTARIL) 10 MG tablet  (Patient not taking: Reported on 01/24/2021)     levothyroxine (SYNTHROID, LEVOTHROID) 150 MCG tablet Take 150 mcg by mouth daily.      metFORMIN (GLUCOPHAGE) 500 MG tablet Take 1,000 mg by mouth 2 (two) times daily.     tolterodine (DETROL LA) 2 MG 24 hr capsule  (Patient not taking: Reported on 01/24/2021)     traZODone (DESYREL) 50 MG tablet Take 50 mg by mouth at bedtime as needed for sleep.     valsartan (DIOVAN) 160 MG tablet Take 160 mg by mouth 2 (two) times daily.      No current facility-administered medications for this visit.     ASSESSMENT & PLAN:   Assessment:    1. Remote history of stage IA triple negative breast cancer diagnosed in June 2002.   2. Stage IA hormone receptor positive right  breast treated with bilateral mastectomies..  She is on adjuvant hormonal therapy with anastrozole 1 mg daily and is tolerating that well. There is firmness/swelling in the high right axilla of uncertain etiology.  We will obtain a right axillary ultrasound for further evaluation.    3. Personal and family history of malignancy.  Genetic testing did not reveal any clinically significant mutations or variants of uncertain significance.     4. Paresthesias and balance difficulties, felt to be likely due to diabetic neuropathy, which is stable.  Plan:    As above, she will undergo a right axillary ultrasound.  If this is normal, we will plan to see her back in 4 months for re-examination.  She will be due for bone density scan in August and I will get that scheduled as well. The patient understands the plans discussed today and is in agreement with them.  She knows to contact our office if she develops concerns prior to her next appointment.  The patient was seen, examined and plan formulated with Dr. Hinton Rao.  Marvia Pickles, PA-C

## 2021-01-31 ENCOUNTER — Telehealth: Payer: Self-pay | Admitting: Hematology and Oncology

## 2021-01-31 ENCOUNTER — Other Ambulatory Visit: Payer: Self-pay

## 2021-01-31 ENCOUNTER — Inpatient Hospital Stay: Payer: Medicare PPO | Attending: Hematology and Oncology | Admitting: Hematology and Oncology

## 2021-01-31 ENCOUNTER — Encounter: Payer: Self-pay | Admitting: Hematology and Oncology

## 2021-01-31 VITALS — BP 160/78 | HR 89 | Temp 97.9°F | Resp 18 | Ht 66.0 in | Wt 239.5 lb

## 2021-01-31 DIAGNOSIS — Z170 Estrogen receptor positive status [ER+]: Secondary | ICD-10-CM | POA: Diagnosis not present

## 2021-01-31 DIAGNOSIS — C50411 Malignant neoplasm of upper-outer quadrant of right female breast: Secondary | ICD-10-CM | POA: Diagnosis not present

## 2021-01-31 DIAGNOSIS — R2231 Localized swelling, mass and lump, right upper limb: Secondary | ICD-10-CM | POA: Diagnosis not present

## 2021-01-31 DIAGNOSIS — Z13820 Encounter for screening for osteoporosis: Secondary | ICD-10-CM

## 2021-01-31 NOTE — Telephone Encounter (Signed)
Per 7/13 los next appt scheduled and confirmed by patient

## 2021-02-12 ENCOUNTER — Encounter: Payer: Self-pay | Admitting: Hematology and Oncology

## 2021-05-28 NOTE — Progress Notes (Signed)
[facility]  [address] [place],  [place]  [address] [phone]  Clinic Day:  01/31/2021  Referring physician: Algis Greenhouse, MD  This document serves as a record of services personally performed by Hosie Poisson, MD. It was created on their behalf by Curry,Lauren E, a trained medical scribe. The creation of this record is based on the scribe's personal observations and the provider's statements to them.  CHIEF COMPLAINT:  CC: Stage IA hormone receptor positive breast cancer  Current Treatment:  Anastrozole $RemoveBefo'1mg'ugPsDiaqLJJ$  daily  HISTORY OF PRESENT ILLNESS:  [name] is a 80 y.o. 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 She was treated with lumpectomy.  Pathology revealed a 1.8 cm, high grade, infiltrating ductal carcinoma.  Sentinel node was negative for metastasis.  Estrogen and progesterone receptors were negative and her 2 Neu negative.  She received adjuvant chemotherapy with Adriamycin and Cytoxan for 4 cycles, followed by postoperative radiation to the right breast.  Due to her personal and family history of cancer, we recommended testing for hereditary breast and ovarian cancer, but she declined.  She was found to have a new stage IA (Tlb N0 M0) hormone receptor positive right breast cancer in June 2020. Routine mammogram revealed a mass int the right breast.  Biopsy revealed grade 2, invasive ductal carcinoma.  Estrogen and progesterone receptors were highly positive.  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  She was referred her to Dr. Noberto Retort for right mastectomy.  She requested  bilateral mastectomies, as this was her second breast cancer.  She underwent bilateral mastectomies in July 2020.  Surgical pathology revealed a 9 mm invasive ductal carcinoma, grade 2, in her right breast.  One sentinel lymph node was tested and was negative for metastatic carcinoma  (0/1).  Estrogen and progesterone receptors were 90% positive and HER2 negative.  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  We recommended adjuvant endocrine therapy with anastrozole 1 mg daily for a total of 5 years, which was started in August 2020.  Bone density scan in August 2020 was normal.  She underwent testing for hereditary breast cancer with the [facility] panel test in July 2020.  This did not reveal any clinically significant mutation or variants of uncertain significance.  Bone scan in May 2021 did not reveal any evidence of malignancy.  There was periarticular uptake about both knees related to bilateral knee arthroplasty. There was increased uptake in the left shoulder, felt to be likely degenerative. She has a history of right shoulder replacement.   INTERVAL HISTORY:  [name] is here for routine follow up and states that she has been doing well. She does report occasional headache, but states these will resolve quickly. She continues anastrozole without significant difficulty. She did not get her bone density in August,so we will reschedule. I explained to her that this medication can cause thinning of the bones. Her  appetite is good, and her weight is stable since her last visit.  She denies fever, chills or other signs of infection.  She denies nausea, vomiting, bowel issues, or abdominal pain.  She denies sore throat, cough, dyspnea, or chest pain.  REVIEW OF SYSTEMS:  Review of Systems  Constitutional: Negative.  Negative for appetite change, chills, fatigue, fever and unexpected weight change.  HENT:  Negative.    Eyes: Negative.   Respiratory: Negative.  Negative for chest tightness, cough, hemoptysis, shortness of breath and wheezing.   Cardiovascular: Negative.  Negative for chest pain, leg swelling and palpitations.  Gastrointestinal: Negative.  Negative for abdominal distention, abdominal pain, blood in stool, constipation,  diarrhea, nausea and vomiting.  Endocrine: Negative.   Genitourinary: Negative.  Negative for difficulty urinating, dysuria, frequency and hematuria.   Musculoskeletal:  Positive for gait problem (unsteady, uses a cane to ambulate). Negative for arthralgias, back pain, flank pain and myalgias.  Skin: Negative.   Neurological:  Positive for gait problem (unsteady, uses a cane to ambulate) and headaches (occasional). Negative for dizziness, extremity weakness, light-headedness, numbness, seizures and speech difficulty.  Hematological: Negative.   Psychiatric/Behavioral: Negative.  Negative for depression and sleep disturbance. The patient is not nervous/anxious.     VITALS:  Blood pressure (!) 160/78, pulse 89, temperature 97.9 F (36.6 C), temperature source Oral, resp. rate 18, height $RemoveBe'5\' 6"'gTTiXoBVb$  (1.676 m), weight 239 lb 8 oz (108.6 kg), SpO2 94 %.  Wt Readings from Last 3 Encounters:  01/31/21 239 lb 8 oz (108.6 kg)  01/24/21 240 lb 12.8 oz (109.2 kg)  08/03/20 240 lb 12.8 oz (109.2 kg)    Body mass index is 38.66 kg/m.  Performance status (ECOG): 1 - Symptomatic but completely ambulatory  PHYSICAL EXAM:  Physical Exam Constitutional:      General: She is not in acute distress.    Appearance: Normal appearance. She is normal weight.  HENT:     Head: Normocephalic and atraumatic.  Eyes:     General: No scleral icterus.    Extraocular Movements: Extraocular movements intact.     Conjunctiva/sclera: Conjunctivae normal.     Pupils: Pupils are equal, round, and reactive to light.  Cardiovascular:     Rate and Rhythm: Normal rate and regular rhythm.     Pulses: Normal pulses.     Heart sounds: Normal heart sounds. No murmur heard.   No friction rub. No gallop.  Pulmonary:     Effort: Pulmonary effort is normal. No respiratory distress.     Breath sounds: Normal breath sounds.  Chest:     Comments: Left mastectomy has some mild nodularity at the lateral aspect. Bilateral mastectomies  are negative.  Abdominal:     General: Bowel sounds are normal. There is no distension.     Palpations: Abdomen is soft. There is no hepatomegaly, splenomegaly or mass.     Tenderness: There is no abdominal tenderness.  Musculoskeletal:        General: Normal range of motion.     Cervical back: Normal range of motion and neck supple.     Right lower leg: No edema.     Left lower leg: No edema.  Lymphadenopathy:     Cervical: No cervical adenopathy.  Skin:    General: Skin is warm and dry.  Neurological:     General: No focal deficit present.     Mental Status: She is alert and oriented to person, place, and time. Mental status is at baseline.  Psychiatric:        Mood and Affect: Mood normal.        Behavior: Behavior normal.        Thought Content: Thought content normal.        Judgment: Judgment normal.    LABS:   CBC Latest Ref Rng & Units 06/21/2009 06/20/2009 06/14/2009  WBC 4.0 - 10.5 K/uL 7.8 5.4 7.8  Hemoglobin 12.0 - 15.0 g/dL 11.3(L) 11.6(L) 14.1  Hematocrit 36.0 - 46.0 % 33.4(L) 33.9(L) 41.6  Platelets 150 - 400 K/uL 186 172 241   CMP Latest Ref Rng & Units 06/21/2009 06/20/2009 06/14/2009  Glucose 70 - 99 mg/dL 141(H) 122(H) 108(H)  BUN 6 - 23 mg/dL $Remove'11 13 19  'NfgWphM$ Creatinine 0.4 - 1.2 mg/dL 0.77 1.03 0.94  Sodium 135 - 145 mEq/L 140 140 144  Potassium 3.5 - 5.1 mEq/L 3.5 3.7 3.9  Chloride 96 - 112 mEq/L 102 105 104  CO2 19 - 32 mEq/L 32 27 32  Calcium 8.4 - 10.5 mg/dL 8.4 8.1(L) 9.2  Total Protein 6.0 - 8.3 g/dL - - 7.1  Total Bilirubin 0.3 - 1.2 mg/dL - - 0.3  Alkaline Phos 39 - 117 U/L - - 85  AST 0 - 37 U/L - - 20  ALT 0 - 35 U/L - - 23    STUDIES:  No results found.    HISTORY:   Allergies:  Allergies  Allergen Reactions   Metformin     Other reaction(s): Other (See Comments) dysegusia    Current Medications: Current Outpatient Medications  Medication Sig Dispense Refill   ALPRAZolam (XANAX) 0.25 MG tablet Take 0.25 mg by mouth at bedtime as  needed for anxiety.     amLODipine (NORVASC) 5 MG tablet Take 5 mg by mouth daily.     amoxicillin (AMOXIL) 500 MG capsule Take 1 capsule by mouth daily.     anastrozole (ARIMIDEX) 1 MG tablet TAKE ONE TABLET BY MOUTH EVERY DAY (Patient taking differently: Take 1 mg by mouth daily.) 90 tablet 5   aspirin EC 81 MG tablet Take 1 tablet (81 mg total) by mouth daily. Swallow whole. (Patient not taking: Reported on 01/24/2021) 90 tablet 3   atorvastatin (LIPITOR) 80 MG tablet Take 80 mg by mouth daily.     cycloSPORINE (RESTASIS) 0.05 % ophthalmic emulsion  (Patient not taking: Reported on 01/24/2021)     empagliflozin (JARDIANCE) 25 MG TABS tablet Take 25 mg by mouth daily.     fluconazole (DIFLUCAN) 100 MG tablet Take 1 tablet by mouth as needed (sclerosis).     furosemide (LASIX) 40 MG tablet Take 40 mg by mouth daily.     glimepiride (AMARYL) 4 MG tablet Take 4 mg by mouth daily with breakfast.     hydrOXYzine (ATARAX/VISTARIL) 10 MG tablet  (Patient not taking: Reported on 01/24/2021)     levothyroxine (SYNTHROID, LEVOTHROID) 150 MCG tablet Take 150 mcg by mouth daily.      metFORMIN (GLUCOPHAGE) 500 MG tablet Take 1,000 mg by mouth 2 (two) times daily.     tolterodine (DETROL LA) 2 MG 24 hr capsule  (Patient not taking: Reported on 01/24/2021)     traZODone (DESYREL) 50 MG tablet Take 50 mg by mouth at bedtime as needed for sleep.     valsartan (DIOVAN) 160 MG tablet Take 160 mg by mouth 2 (two) times daily.      No current facility-administered medications for this visit.     ASSESSMENT & PLAN:   Assessment:   1. Remote history of stage IA triple negative breast cancer diagnosed in June 2002.   2. Stage IA hormone receptor positive right breast, diagnosed in June 2020, treated with bilateral mastectomies. She is on adjuvant hormonal therapy with anastrozole 1 mg daily and is tolerating that well.   3. Personal and family history of malignancy.  Genetic testing did not reveal any clinically  significant mutations or variants of uncertain significance.  4. 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  Plan:     I will schedule her for bone density as she remains on anastrozole. We will plan to see her back in 6 months for re-examination. The patient understands the plans discussed today and is in agreement with them.  She knows to contact our office if she develops concerns prior to her next appointment.  I provided 15 minutes of face-to-face time during this this encounter and > 50% was spent counseling as documented under my assessment and plan.    I, Rita Ohara, am acting as scribe for Derwood Kaplan, MD  I have reviewed this report as typed by the medical scribe, and it is complete and accurate.

## 2021-06-04 ENCOUNTER — Inpatient Hospital Stay: Payer: Medicare PPO | Attending: Oncology | Admitting: Oncology

## 2021-06-04 ENCOUNTER — Encounter: Payer: Self-pay | Admitting: Oncology

## 2021-06-04 VITALS — BP 141/98 | HR 81 | Temp 97.7°F | Resp 18 | Ht 66.0 in | Wt 240.3 lb

## 2021-06-04 DIAGNOSIS — Z170 Estrogen receptor positive status [ER+]: Secondary | ICD-10-CM

## 2021-06-04 DIAGNOSIS — D649 Anemia, unspecified: Secondary | ICD-10-CM

## 2021-06-04 DIAGNOSIS — C50411 Malignant neoplasm of upper-outer quadrant of right female breast: Secondary | ICD-10-CM | POA: Diagnosis not present

## 2021-06-11 ENCOUNTER — Telehealth: Payer: Self-pay | Admitting: Oncology

## 2021-06-11 NOTE — Telephone Encounter (Signed)
Unable to reach patient by phone - Mailed revised DEXA to Feb 2023 - May2023 Follow Up

## 2021-12-03 ENCOUNTER — Ambulatory Visit: Payer: Medicare PPO | Admitting: Oncology

## 2021-12-31 NOTE — Progress Notes (Signed)
[facility]  [address] [place],  [place]  [address] [phone]  Clinic Day:  01/01/22  Referring physician: Algis Greenhouse, MD  CHIEF COMPLAINT:  CC: Stage IA hormone receptor positive breast cancer  Current Treatment:  Anastrozole $RemoveBefo'1mg'iRSmizlMTXV$  daily  HISTORY OF PRESENT ILLNESS:  [name] is a 81 y.o. 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 She was treated with lumpectomy.  Pathology revealed a 1.8 cm, high grade, infiltrating ductal carcinoma.  Sentinel node was negative for metastasis.  Estrogen and progesterone receptors were negative and her 2 Neu negative.  She received adjuvant chemotherapy with Adriamycin and Cytoxan for 4 cycles, followed by postoperative radiation to the right breast.  Due to her personal and family history of cancer, we recommended testing for hereditary breast and ovarian cancer, but she declined.  She was found to have a new stage IA (Tlb N0 M0) hormone receptor positive right breast cancer in June 2020. Routine mammogram revealed a mass int the right breast.  Biopsy revealed grade 2, invasive ductal carcinoma.  Estrogen and progesterone receptors were highly positive.  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  She was referred her to Dr. Noberto Retort for right mastectomy.  She requested  bilateral mastectomies, as this was her second breast cancer.  She underwent bilateral mastectomies in July 2020.  Surgical pathology revealed a 9 mm invasive ductal carcinoma, grade 2, in her right breast.  One sentinel lymph node was tested and was negative for metastatic carcinoma (0/1).  Estrogen and progesterone receptors were 90% positive and HER2 negative.  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  We recommended adjuvant endocrine therapy with anastrozole 1 mg daily for a total of 5 years, which was started in August 2020.  Bone  density scan in August 2020 was normal.  She underwent testing for hereditary breast cancer with the [facility] Hereditary Cancer panel test in July 2020.  This did not reveal any clinically significant mutation or variants of uncertain significance.  Bone scan in May 2021 did not reveal any evidence of malignancy.  There was periarticular uptake about both knees related to bilateral knee arthroplasty. There was increased uptake in the left shoulder, felt to be likely degenerative. She has a history of right shoulder replacement.  INTERVAL HISTORY Jamie is here for routine follow up and states that she has been doing well. She continues anastrozole without significant difficulty. She did not get her bone density, so we will reschedule. I explained to her that this medication can cause thinning of the bones. She has had some falls. Her  appetite is good, and her weight is decreased 16 pounds.  She denies fever, chills or other signs of infection.  She denies nausea, vomiting, bowel issues, or abdominal pain.  She denies sore throat, cough, dyspnea, or chest pain.  REVIEW OF SYSTEMS:  Review of Systems  Constitutional: Negative.  Negative for appetite change, chills, fatigue, fever and unexpected weight change.  HENT:  Negative.    Eyes: Negative.   Respiratory: Negative.  Negative for chest tightness, cough, hemoptysis, shortness of breath and wheezing.   Cardiovascular: Negative.  Negative for chest pain, leg swelling and palpitations.  Gastrointestinal: Negative.  Negative for abdominal distention, abdominal pain, blood in stool, constipation, diarrhea, nausea and vomiting.  Endocrine: Negative.   Genitourinary: Negative.  Negative for difficulty urinating, dysuria, frequency  and hematuria.   Musculoskeletal:  Negative for arthralgias, back pain, flank pain and myalgias. Gait problem: unsteady, uses a cane to ambulate. Skin: Negative.   Neurological:  Negative for dizziness, extremity  weakness, light-headedness, numbness, seizures and speech difficulty. Gait problem: unsteady, uses a cane to ambulate. Headaches: occasional. Hematological: Negative.   Psychiatric/Behavioral: Negative.  Negative for depression and sleep disturbance. The patient is not nervous/anxious.      VITALS:  Blood pressure (!) 179/94, pulse 69, temperature 97.7 F (36.5 C), temperature source Oral, resp. rate 18, height 5\' 6"  (1.676 m), weight 224 lb 12.8 oz (102 kg), SpO2 97 %.  Wt Readings from Last 3 Encounters:  01/01/22 224 lb 12.8 oz (102 kg)  06/04/21 240 lb 4.8 oz (109 kg)  01/31/21 239 lb 8 oz (108.6 kg)    Body mass index is 36.28 kg/m.  Performance status (ECOG): 1 - Symptomatic but completely ambulatory  PHYSICAL EXAM:  Physical Exam Constitutional:      General: She is not in acute distress.    Appearance: Normal appearance. She is normal weight.  HENT:     Head: Normocephalic and atraumatic.  Eyes:     General: No scleral icterus.    Extraocular Movements: Extraocular movements intact.     Conjunctiva/sclera: Conjunctivae normal.     Pupils: Pupils are equal, round, and reactive to light.  Cardiovascular:     Rate and Rhythm: Normal rate and regular rhythm.     Pulses: Normal pulses.     Heart sounds: Normal heart sounds. No murmur heard.    No friction rub. No gallop.  Pulmonary:     Effort: Pulmonary effort is normal. No respiratory distress.     Breath sounds: Normal breath sounds.  Chest:     Comments: Left mastectomy has some mild nodularity at the lateral aspect. Bilateral mastectomies are negative.  Abdominal:     General: Bowel sounds are normal. There is no distension.     Palpations: Abdomen is soft. There is no hepatomegaly, splenomegaly or mass.     Tenderness: There is no abdominal tenderness.  Musculoskeletal:        General: Normal range of motion.     Cervical back: Normal range of motion and neck supple.     Right lower leg: No edema.     Left  lower leg: No edema.  Lymphadenopathy:     Cervical: No cervical adenopathy.  Skin:    General: Skin is warm and dry.  Neurological:     General: No focal deficit present.     Mental Status: She is alert and oriented to person, place, and time. Mental status is at baseline.  Psychiatric:        Mood and Affect: Mood normal.        Behavior: Behavior normal.        Thought Content: Thought content normal.        Judgment: Judgment normal.     LABS:      Latest Ref Rng & Units 06/21/2009    4:40 AM 06/20/2009    5:00 AM 06/14/2009   12:40 PM  CBC  WBC 4.0 - 10.5 K/uL 7.8  5.4  7.8   Hemoglobin 12.0 - 15.0 g/dL 06/16/2009  00.0  50.5   Hematocrit 36.0 - 46.0 % 33.4  33.9  41.6   Platelets 150 - 400 K/uL 186  172  241       Latest Ref Rng & Units 06/21/2009  4:40 AM 06/20/2009    5:00 AM 06/14/2009   12:40 PM  CMP  Glucose 70 - 99 mg/dL 141  122  108   BUN 6 - 23 mg/dL $Remove'11  13  19   'fymFLrO$ Creatinine 0.4 - 1.2 mg/dL 0.77  1.03  0.94   Sodium 135 - 145 mEq/L 140  140  144   Potassium 3.5 - 5.1 mEq/L 3.5  3.7  3.9   Chloride 96 - 112 mEq/L 102  105  104   CO2 19 - 32 mEq/L 32  27  32   Calcium 8.4 - 10.5 mg/dL 8.4  8.1  9.2   Total Protein 6.0 - 8.3 g/dL   7.1   Total Bilirubin 0.3 - 1.2 mg/dL   0.3   Alkaline Phos 39 - 117 U/L   85   AST 0 - 37 U/L   20   ALT 0 - 35 U/L   23     STUDIES:  No results found.    HISTORY:   Allergies:  Allergies  Allergen Reactions   Metformin     Other reaction(s): Other (See Comments) dysegusia Other reaction(s): Other (See Comments) dysegusia / pt. states she isn't allergic can't tolerate the smell    Current Medications: Current Outpatient Medications  Medication Sig Dispense Refill   ALPRAZolam (XANAX) 0.25 MG tablet Take by mouth.     clonazePAM (KLONOPIN) 1 MG tablet Take by mouth.     amLODipine (NORVASC) 5 MG tablet Take 5 mg by mouth daily.     anastrozole (ARIMIDEX) 1 MG tablet TAKE ONE TABLET BY MOUTH EVERY DAY 90 tablet 5    atorvastatin (LIPITOR) 80 MG tablet Take 80 mg by mouth daily.     empagliflozin (JARDIANCE) 25 MG TABS tablet Take 25 mg by mouth daily.     fluconazole (DIFLUCAN) 100 MG tablet Take 1 tablet by mouth as needed (sclerosis).     furosemide (LASIX) 40 MG tablet Take 40 mg by mouth daily.     glimepiride (AMARYL) 4 MG tablet Take 4 mg by mouth daily with breakfast.     levothyroxine (SYNTHROID, LEVOTHROID) 150 MCG tablet Take 150 mcg by mouth daily.      metFORMIN (GLUCOPHAGE) 500 MG tablet Take 1,000 mg by mouth 2 (two) times daily.     tolterodine (DETROL LA) 2 MG 24 hr capsule  (Patient not taking: Reported on 01/24/2021)     traZODone (DESYREL) 50 MG tablet Take 50 mg by mouth at bedtime as needed for sleep.     trospium (SANCTURA) 20 MG tablet Take 20 mg by mouth 2 (two) times daily.     valsartan (DIOVAN) 160 MG tablet Take 160 mg by mouth 2 (two) times daily.      No current facility-administered medications for this visit.     ASSESSMENT & PLAN:   Assessment:   1. Remote history of stage IA triple negative breast cancer diagnosed in June 2002.   2. Stage IA hormone receptor positive right breast, diagnosed in June 2020, treated with bilateral mastectomies. She is on adjuvant hormonal therapy with anastrozole 1 mg daily and is tolerating that well.   3. Personal and family history of malignancy.  Genetic testing did not reveal any clinically significant mutations or variants of uncertain significance.     4. 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  Plan:     I  will schedule her for bone density as she remains on anastrozole. She is now 3 years postop from her 2nd breast cancer with no evidence of disease. I will get her DEXA rescheduled.  I will plan to see her back in 1 year for re-examination. The patient understands the plans discussed today and is in agreement with them.  She  knows to contact our office if she develops concerns prior to her next appointment.  I provided 15 minutes of face-to-face time during this this encounter and > 50% was spent counseling as documented under my assessment and plan.

## 2022-01-01 ENCOUNTER — Telehealth: Payer: Self-pay

## 2022-01-01 ENCOUNTER — Encounter: Payer: Self-pay | Admitting: Oncology

## 2022-01-01 ENCOUNTER — Inpatient Hospital Stay: Payer: Medicare PPO | Attending: Oncology | Admitting: Oncology

## 2022-01-01 VITALS — BP 179/94 | HR 69 | Temp 97.7°F | Resp 18 | Ht 66.0 in | Wt 224.8 lb

## 2022-01-01 DIAGNOSIS — C50411 Malignant neoplasm of upper-outer quadrant of right female breast: Secondary | ICD-10-CM | POA: Diagnosis not present

## 2022-01-01 DIAGNOSIS — Z780 Asymptomatic menopausal state: Secondary | ICD-10-CM

## 2022-01-01 DIAGNOSIS — Z170 Estrogen receptor positive status [ER+]: Secondary | ICD-10-CM

## 2022-01-01 DIAGNOSIS — M8580 Other specified disorders of bone density and structure, unspecified site: Secondary | ICD-10-CM | POA: Diagnosis not present

## 2022-01-01 NOTE — Telephone Encounter (Signed)
Patient did not get DEXA scan done. It was scheduled for 08/20222.

## 2022-01-01 NOTE — Telephone Encounter (Signed)
-----   Message from Derwood Kaplan, MD sent at 12/31/2021  7:58 PM EDT ----- Regarding: DEXA Did she ever get the DEXA last year?

## 2022-01-18 ENCOUNTER — Other Ambulatory Visit: Payer: Self-pay | Admitting: Oncology

## 2022-01-24 DIAGNOSIS — M8580 Other specified disorders of bone density and structure, unspecified site: Secondary | ICD-10-CM | POA: Insufficient documentation

## 2022-01-28 ENCOUNTER — Telehealth: Payer: Self-pay

## 2022-01-28 NOTE — Telephone Encounter (Signed)
Patient notified

## 2022-01-28 NOTE — Telephone Encounter (Signed)
-----   Message from Derwood Kaplan, MD sent at 01/27/2022 12:52 PM EDT ----- Regarding: call Tell her DEXA just shows slight osteopenia, (borderline thinning) of the right hip, otherwise good.  Make sure she is taking calcium and vitamin D.  We will repeat in 2 years. Cc Dr. Garlon Hatchet

## 2022-03-04 ENCOUNTER — Encounter: Payer: Self-pay | Admitting: Oncology

## 2022-03-06 ENCOUNTER — Encounter: Payer: Self-pay | Admitting: Oncology

## 2023-01-03 ENCOUNTER — Other Ambulatory Visit: Payer: Self-pay | Admitting: Oncology

## 2023-01-03 ENCOUNTER — Encounter: Payer: Self-pay | Admitting: Oncology

## 2023-01-03 ENCOUNTER — Inpatient Hospital Stay: Payer: Medicare PPO | Attending: Oncology | Admitting: Oncology

## 2023-01-03 VITALS — BP 150/106 | HR 67 | Temp 97.4°F | Resp 18 | Ht 66.0 in | Wt 200.5 lb

## 2023-01-03 DIAGNOSIS — M8580 Other specified disorders of bone density and structure, unspecified site: Secondary | ICD-10-CM

## 2023-01-03 DIAGNOSIS — W57XXXA Bitten or stung by nonvenomous insect and other nonvenomous arthropods, initial encounter: Secondary | ICD-10-CM

## 2023-01-03 DIAGNOSIS — Z171 Estrogen receptor negative status [ER-]: Secondary | ICD-10-CM

## 2023-01-03 DIAGNOSIS — C50911 Malignant neoplasm of unspecified site of right female breast: Secondary | ICD-10-CM | POA: Diagnosis not present

## 2023-01-03 DIAGNOSIS — Z170 Estrogen receptor positive status [ER+]: Secondary | ICD-10-CM

## 2023-01-03 DIAGNOSIS — C50411 Malignant neoplasm of upper-outer quadrant of right female breast: Secondary | ICD-10-CM | POA: Diagnosis not present

## 2023-01-03 DIAGNOSIS — Z780 Asymptomatic menopausal state: Secondary | ICD-10-CM

## 2023-01-03 MED ORDER — DOXYCYCLINE HYCLATE 100 MG PO TABS: 100.0000 mg | ORAL_TABLET | Freq: Two times a day (BID) | ORAL | 0 refills | Status: AC

## 2023-01-03 NOTE — Progress Notes (Signed)
[facility]  [address] [place],  [place]  [address] [phone]  Clinic Day:  01/03/2023  Referring physician: Olive Bass, MD  CHIEF COMPLAINT:  CC: Stage IA hormone receptor positive breast cancer  Current Treatment:  Anastrozole 1mg  daily  HISTORY OF PRESENT ILLNESS:  [name] is a 82 y.o. 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 She was treated with lumpectomy.  Pathology revealed a 1.8 cm, high grade, infiltrating ductal carcinoma.  Sentinel node was negative for metastasis.  Estrogen and progesterone receptors were negative and her 2 Neu negative.  She received adjuvant chemotherapy with Adriamycin and Cytoxan for 4 cycles, followed by postoperative radiation to the right breast.  Due to her personal and family history of cancer, we recommended testing for hereditary breast and ovarian cancer, but she declined.  She was found to have a new stage IA (Tlb N0 M0) hormone receptor positive right breast cancer in June 2020. Routine mammogram revealed a mass int the right breast.  Biopsy revealed grade 2, invasive ductal carcinoma.  Estrogen and progesterone receptors were highly positive.  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  She was referred her to Dr. Georgiana Shore for right mastectomy.  She requested  bilateral mastectomies, as this was her second breast cancer.  She underwent bilateral mastectomies in July 2020.  Surgical pathology revealed a 9 mm invasive ductal carcinoma, grade 2, in her right breast.  One sentinel lymph node was tested and was negative for metastatic carcinoma (0/1).  Estrogen and progesterone receptors were 90% positive and HER2 negative.  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  We recommended adjuvant endocrine therapy with anastrozole 1 mg daily for a total of 5 years, which was started in August 2020.  Bone  density scan in August 2020 was normal.  She underwent testing for hereditary breast cancer with the [facility] Hereditary Cancer panel test in July 2020.  This did not reveal any clinically significant mutation or variants of uncertain significance.  Bone scan in May 2021 did not reveal any evidence of malignancy.  There was periarticular uptake about both knees related to bilateral knee arthroplasty. There was increased uptake in the left shoulder, felt to be likely degenerative. She has a history of right shoulder replacement.  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 She informed he that her husband passed in [place] after 2 weeks of a newly diagnosed Leukemia. He had just finished treatment for lung cancer. She continues anastrozole without significant difficulty. Patient states that she feels well but complains of right lower quadrant pain intermittently and cough. She has no change in bowel movement. She has no dysuria or hematuria. I recommended having a CAT scan done if the pain gets worse.  Her BP was 150/106 at the beginning of her appointment. She informed me that she found a tick on the top of her head but she doesn't know how long it was on there. She inquired about a lyme disease test. I informed her that the test might not be accurate but I would go ahead and treat her with Doxycycline 100mg  BID for 10 days to prevent the disease from manifesting .She also showed me where a suspicious  lesion on her left index and middle finger was that she showed Dr Fatima Blank, the dermatologist.  He thought the lesion on the middle finger was cancerous so it was removed about 2 weeks ago, and he froze the lesions on the index finger.  I requested a pathology report. Since her last appointment she had her bone density done and it shows just slight  worsening. It had previously been normal and she now has osteopenia with a T-score of -1.4 of the right femur but the other readings remain normal. Over all it looks really good considering that she is 82 years old. I will see her back in 1 year. She  denies signs of infections such as sore throat, sinus drainage or urinary symptoms. She  denies fever or recurrent chills. She  also deny nausea, vomiting, chest pain dyspnea or cough. Her  appetite is good. Her  weight has decreased by 24 pounds in the last 1 year, but she has been in mourning.   REVIEW OF SYSTEMS:  Review of Systems  Constitutional: Negative.  Negative for appetite change, chills, diaphoresis, fatigue, fever and unexpected weight change.  HENT:  Negative.  Negative for hearing loss, lump/mass, mouth sores, nosebleeds, sore throat, tinnitus, trouble swallowing and voice change.   Eyes: Negative.  Negative for eye problems and icterus.  Respiratory: Negative.  Negative for chest tightness, cough, hemoptysis, shortness of breath and wheezing.   Cardiovascular: Negative.  Negative for chest pain, leg swelling and palpitations.  Gastrointestinal:  Positive for abdominal pain (right lowe quadrant, intermittent). Negative for abdominal distention, blood in stool, constipation, diarrhea, nausea and vomiting.  Endocrine: Negative.   Genitourinary: Negative.  Negative for bladder incontinence, difficulty urinating, dyspareunia, dysuria, frequency, hematuria, menstrual problem, nocturia, pelvic pain, vaginal bleeding and vaginal discharge.   Musculoskeletal:  Negative for arthralgias, back pain, flank pain, gait problem (unsteady, uses a cane to ambulate), myalgias, neck pain and neck stiffness.  Skin: Negative.  Negative for itching, rash and wound.  Neurological:  Negative for dizziness, extremity weakness, gait problem (unsteady, uses a cane to ambulate), headaches, light-headedness, numbness, seizures and speech difficulty.  Hematological:  Negative.  Negative for adenopathy. Does not bruise/bleed easily.  Psychiatric/Behavioral: Negative.  Negative for confusion, decreased concentration, depression, sleep disturbance and suicidal ideas. The patient is not nervous/anxious.      VITALS:  Blood pressure (!) 150/106, pulse 67, temperature (!) 97.4 F (36.3 C), temperature source Oral, resp. rate 18, height 5\' 6"  (1.676 m), weight 200 lb 8 oz (90.9 kg), SpO2 99 %.  Wt Readings from Last 3 Encounters:  01/03/23 200 lb 8 oz (90.9 kg)  01/01/22 224 lb 12.8 oz (102 kg)  06/04/21 240 lb 4.8 oz (109 kg)    Body mass index is 32.36 kg/m.  Performance status (ECOG): 1 - Symptomatic but completely ambulatory  PHYSICAL EXAM:  Physical Exam Vitals and nursing note reviewed.  Constitutional:      General: She is not in acute distress.    Appearance: Normal appearance. She is normal weight.  HENT:     Head: Normocephalic and atraumatic.  Eyes:     General: No scleral icterus.    Extraocular Movements: Extraocular movements intact.     Conjunctiva/sclera: Conjunctivae normal.     Pupils: Pupils are equal, round, and reactive to light.  Cardiovascular:     Rate and Rhythm: Normal rate and regular rhythm.     Pulses: Normal pulses.     Heart sounds: Normal heart sounds. No  murmur heard.    No friction rub. No gallop.  Pulmonary:     Effort: Pulmonary effort is normal. No respiratory distress.     Breath sounds: Normal breath sounds.  Chest:     Comments: Bilateral mastectomies are negative.   Abdominal:     General: Bowel sounds are normal. There is no distension.     Palpations: Abdomen is soft. There is no hepatomegaly, splenomegaly or mass.     Tenderness: There is no abdominal tenderness.  Musculoskeletal:        General: Normal range of motion.     Cervical back: Normal range of motion and neck supple.     Right lower leg: No edema.     Left lower leg: No edema.  Lymphadenopathy:     Cervical: No cervical adenopathy.   Skin:    General: Skin is warm and dry.  Neurological:     General: No focal deficit present.     Mental Status: She is alert and oriented to person, place, and time. Mental status is at baseline.  Psychiatric:        Mood and Affect: Mood normal.        Behavior: Behavior normal.        Thought Content: Thought content normal.        Judgment: Judgment normal.     LABS:   Component Ref Range & Units 11/19/2022  Sodium 136 - 145 mmol/L 141  Potassium 3.5 - 5.1 mmol/L 4.3  Comment: NO VISIBLE HEMOLYSIS  Chloride 98 - 107 mmol/L 105  CO2 21 - 31 mmol/L 27  Anion Gap 6 - 14 mmol/L 9  Glucose, Random 70 - 99 mg/dL 829 High   Blood Urea Nitrogen (BUN) 7 - 25 mg/dL 13  Creatinine 5.62 - 1.30 mg/dL 8.65  eGFR >78 IO/NGE/9.52W4 79   Albumin 3.5 - 5.7 g/dL 3.9  Total Protein 6.4 - 8.9 g/dL 6.1 Low   Bilirubin, Total 0.3 - 1.0 mg/dL 0.5  Alkaline Phosphatase (ALP) 34 - 104 U/L 69  Aspartate Aminotransferase (AST) 13 - 39 U/L 17  Alanine Aminotransferase (ALT) 7 - 52 U/L 23  Calcium 8.6 - 10.3 mg/dL 9.2  BUN/Creatinine Ratio   Comment: Creatinine is normal, ratio is not clinically indicated.   Component Ref Range & Units 11/19/2022    Cholesterol, Total, Lipid Panel <200 mg/dL 132    Triglycerides, Lipid Panel <150 mg/dL 59    HDL Cholesterol - Lipid Panel >=60 mg/dL 47 Low     LDL Cholesterol, Calculated <100 mg/dL 51    Non-HDL Cholesterol mg/dL 65   Component Ref Range & Units 11/19/2022  TSH 0.450 - 5.330 uIU/mL 2.603   Component Ref Range & Units 11/19/2022    A1C % 4.2 - 5.6 % 5.9 Abnormal       Latest Ref Rng & Units 06/21/2009    4:40 AM 06/20/2009    5:00 AM 06/14/2009   12:40 PM  CBC  WBC 4.0 - 10.5 K/uL 7.8  5.4  7.8   Hemoglobin 12.0 - 15.0 g/dL 44.0  10.2  72.5   Hematocrit 36.0 - 46.0 % 33.4  33.9  41.6   Platelets 150 - 400 K/uL 186  172  241       Latest Ref Rng & Units 06/21/2009    4:40 AM 06/20/2009    5:00 AM  06/14/2009   12:40 PM  CMP  Glucose 70 - 99 mg/dL 366  440  347  BUN 6 - 23 mg/dL 11  13  19    Creatinine 0.4 - 1.2 mg/dL 1.61  0.96  0.45   Sodium 135 - 145 mEq/L 140  140  144   Potassium 3.5 - 5.1 mEq/L 3.5  3.7  3.9   Chloride 96 - 112 mEq/L 102  105  104   CO2 19 - 32 mEq/L 32  27  32   Calcium 8.4 - 10.5 mg/dL 8.4  8.1  9.2   Total Protein 6.0 - 8.3 g/dL   7.1   Total Bilirubin 0.3 - 1.2 mg/dL   0.3   Alkaline Phos 39 - 117 U/L   85   AST 0 - 37 U/L   20   ALT 0 - 35 U/L   23     STUDIES:  No results found.     HISTORY:   Allergies:  Allergies  Allergen Reactions   Metformin Other (See Comments)    Other reaction(s): Other (See Comments)  dysegusia  dysegusia / pt. states she isn't allergic can't tolerate the smell    Current Medications: Current Outpatient Medications  Medication Sig Dispense Refill   ALPRAZolam (XANAX) 0.25 MG tablet Take by mouth.     amLODipine (NORVASC) 5 MG tablet Take 5 mg by mouth daily.     anastrozole (ARIMIDEX) 1 MG tablet TAKE ONE TABLET BY MOUTH EVERY DAY 90 tablet 5   atorvastatin (LIPITOR) 80 MG tablet Take 80 mg by mouth daily.     clonazePAM (KLONOPIN) 1 MG tablet Take by mouth.     empagliflozin (JARDIANCE) 25 MG TABS tablet Take 25 mg by mouth daily.     fluconazole (DIFLUCAN) 100 MG tablet Take 1 tablet by mouth as needed (sclerosis).     furosemide (LASIX) 40 MG tablet Take 40 mg by mouth daily.     glimepiride (AMARYL) 4 MG tablet Take 4 mg by mouth daily with breakfast.     levothyroxine (SYNTHROID, LEVOTHROID) 150 MCG tablet Take 150 mcg by mouth daily.      metFORMIN (GLUCOPHAGE) 500 MG tablet Take 1,000 mg by mouth 2 (two) times daily.     traZODone (DESYREL) 50 MG tablet Take 50 mg by mouth at bedtime as needed for sleep.     trospium (SANCTURA) 20 MG tablet Take 20 mg by mouth 2 (two) times daily.     valsartan (DIOVAN) 160 MG tablet Take 160 mg by mouth 2 (two) times daily.      No current facility-administered  medications for this visit.     ASSESSMENT & PLAN:   Assessment:   1. Remote history of stage IA triple negative breast cancer diagnosed in June 2002.   2. Stage IA hormone receptor positive right breast, diagnosed in June 2020, treated with bilateral mastectomies. She is on adjuvant hormonal therapy with anastrozole 1 mg daily and is tolerating that well.   3. Personal and family history of malignancy.  Genetic testing did not reveal any clinically significant mutations or variant of uncertain significance.     4. 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  5. Osteopenia. This was diagnosed on bone density scan in July, 2023 and is mild with a T-score of -1.4 of the right femur. The forearm is normal.   6. Tick bite of right scalp. I will place her on Doxycycline as prophylaxis for lyme disease.  Plan:  Patient states that she feels well but complains of right lower quadrant pain intermittently and cough. She has no change in bowel movement. She has no dysuria or hematuria. I recommended having a CAT scan done if the pain gets worse/persists.  Her BP was 150/106 at the beginning of her appointment. She informed me that she found a tick on the top of her head but she doesn't know how long it was on there. She inquired about a Lyme disease test. I informed her that the test might not be accurate but I would go ahead and treat her with Doxycycline 100mg  BID for 10 days to prevent the disease from manifesting .She also showed me where a suspicious lesion on her left index and middle finger was that she showed Dr Fatima Blank, the dermatologist.  He thought the lesion on the middle finger was cancerous so it was removed about 2 weeks ago, and he froze the lesions on the index finger.  I requested a pathology report. Since her last appointment she had her bone density done and it shows just slight  worsening. It had previously been normal and she now has osteopenia with a T-score of -1.4 of the right femur but the other readings remain normal. Over all it looks really good considering that she is 82 years old. I will see her back in 1 year. The patient understands the plans discussed today and is in agreement with them.  She knows to contact our office if she develops concerns prior to her next appointment.  I provided 23 minutes of face-to-face time during this this encounter and > 50% was spent counseling as documented under my assessment and plan.      I,Oluwatobi Asade,acting as a scribe for Dellia Beckwith, MD.,have documented all relevant documentation on the behalf of Dellia Beckwith, MD,as directed by  Dellia Beckwith, MD while in the presence of Dellia Beckwith, MD.

## 2023-02-06 ENCOUNTER — Other Ambulatory Visit: Payer: Self-pay | Admitting: Oncology

## 2023-10-24 ENCOUNTER — Telehealth: Payer: Self-pay | Admitting: Gastroenterology

## 2023-10-24 NOTE — Telephone Encounter (Signed)
 Good afternoon Dr. Chales Abrahams,   We received a call from this patient wishing to schedule an appointment due to a referral for epigastric pain. Patient states that she has been experiencing pain for the past three to four months. She was recently was seen by [facility] in 07/2023. Patient states she was not happy with that provider and she would like to now be seen by you due to her son being your patient. Records from consult are in Care Everywhere for you to review. Would you please advise on scheduling?  Thank you.

## 2023-10-28 NOTE — Telephone Encounter (Signed)
 OK for APP clinic-first available.  Sooner if any cancellation RG

## 2023-10-30 NOTE — Telephone Encounter (Signed)
 Left voicemail for patient to call back and schedule appointment.

## 2023-10-31 ENCOUNTER — Encounter: Payer: Self-pay | Admitting: Gastroenterology

## 2023-11-06 ENCOUNTER — Telehealth: Payer: Self-pay

## 2023-11-06 NOTE — Telephone Encounter (Signed)
 Pt walked into clinic asking to be seen sooner than scheduled appt. She has a place on her back that has started hurting. She states, "I have had it for quite some time, but it didn't hurt. My daughter looked at it and said it is smaller than finger nail". Pain described as stinging. I asked if she had seen her PCP recently. She saw him 3 weeks ago but didn't mention to him as it wasn't hurting. I notified Dr Almer Jacobson of above. She encourages PCP visit, but if not, she was fine with moving her appt up from June. Pt really wants Dr Almer Jacobson to see her if possible, appt given for 11/12/2023 @ 1630.

## 2023-11-12 ENCOUNTER — Inpatient Hospital Stay: Attending: Oncology | Admitting: Oncology

## 2023-11-12 ENCOUNTER — Encounter: Payer: Self-pay | Admitting: Oncology

## 2023-11-12 ENCOUNTER — Other Ambulatory Visit: Payer: Self-pay | Admitting: Oncology

## 2023-11-12 VITALS — BP 156/99 | HR 91 | Temp 98.1°F | Resp 18 | Ht 66.0 in | Wt 198.6 lb

## 2023-11-12 DIAGNOSIS — C50911 Malignant neoplasm of unspecified site of right female breast: Secondary | ICD-10-CM | POA: Insufficient documentation

## 2023-11-12 DIAGNOSIS — Z79899 Other long term (current) drug therapy: Secondary | ICD-10-CM | POA: Diagnosis not present

## 2023-11-12 DIAGNOSIS — Z79811 Long term (current) use of aromatase inhibitors: Secondary | ICD-10-CM | POA: Diagnosis not present

## 2023-11-12 DIAGNOSIS — M85851 Other specified disorders of bone density and structure, right thigh: Secondary | ICD-10-CM | POA: Diagnosis not present

## 2023-11-12 DIAGNOSIS — Z9013 Acquired absence of bilateral breasts and nipples: Secondary | ICD-10-CM | POA: Diagnosis not present

## 2023-11-12 DIAGNOSIS — N2889 Other specified disorders of kidney and ureter: Secondary | ICD-10-CM

## 2023-11-12 DIAGNOSIS — Z171 Estrogen receptor negative status [ER-]: Secondary | ICD-10-CM | POA: Diagnosis not present

## 2023-11-12 DIAGNOSIS — R1031 Right lower quadrant pain: Secondary | ICD-10-CM

## 2023-11-12 NOTE — Progress Notes (Signed)
 [facility]  [address] [place],  [place]  [address] [phone]  Clinic Day: 11/12/2023  Referring physician: Madlyn Schirmer, MD  CHIEF COMPLAINT:  CC: Stage IA hormone receptor positive breast cancer  Current Treatment:  Anastrozole 1mg  daily  HISTORY OF PRESENT ILLNESS:  [name] is a 83 y.o. 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 She was treated with lumpectomy.  Pathology revealed a 1.8 cm, high grade, infiltrating ductal carcinoma.  Sentinel node was negative for metastasis.  Estrogen and progesterone receptors were negative and her 2 Neu negative.  She received adjuvant chemotherapy with Adriamycin and Cytoxan for 4 cycles, followed by postoperative radiation to the right breast.  Due to her personal and family history of cancer, we recommended testing for hereditary breast and ovarian cancer, but she declined.  She was found to have a new stage IA (Tlb N0 M0) hormone receptor positive right breast cancer in June 2020. Routine mammogram revealed a mass int the right breast.  Biopsy revealed grade 2, invasive ductal carcinoma.  Estrogen and progesterone receptors were highly positive.  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  She was referred her to Dr. Jonita Neth for right mastectomy.  She requested  bilateral mastectomies, as this was her second breast cancer.  She underwent bilateral mastectomies in July 2020.  Surgical pathology revealed a 9 mm invasive ductal carcinoma, grade 2, in her right breast.  One sentinel lymph node was tested and was negative for metastatic carcinoma (0/1).  Estrogen and progesterone receptors were 90% positive and HER2 negative.  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  We recommended adjuvant endocrine therapy with anastrozole 1 mg daily for a total of 5 years, which was started in August 2020.  Bone density scan in August  2020 was normal.  She underwent testing for hereditary breast cancer with the [facility] Hereditary Cancer panel test in July 2020.  This did not reveal any clinically significant mutation or variants of uncertain significance.  Bone scan in May 2021 did not reveal any evidence of malignancy.  There was periarticular uptake about both knees related to bilateral knee arthroplasty. There was increased uptake in the left shoulder, felt to be likely degenerative. She has a history of right shoulder replacement.  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 She scheduled her appointment earlier due to two problems she has been having. She informed me that her husband passed in September, 2023 after 2 weeks of a newly diagnosed Leukemia. He had just finished treatment for lung cancer. She continues anastrozole without significant difficulty and will be ready to complete her 5 years this summer. Patient states that she feels ok but complains of thinning of the hair, stinging right upper shoulder pain at a persistent skin lesion, and right lower quadrant pain that has persisted since last fall but has recently gotten worse and caused her sleep disturbance. She went to Dr. Mcdonald Speller for further evaluation of this pain and he ordered a CT abdomen and pelvis, which was done on 05/01/2023 and revealed a mildly distended gallbladder, indeterminate low-density structure in the left kidney interpolar region measuring 2.4 cm. She had further imaging done and ultrasound of the abdomen done on 05/22/2023  revealed no evidence of cholelithiasis or cholecystitis. NM biliary imaging done on 06/03/2023 revealed no hepatobiliary findings typically associated with acute cholecystitis or biliary obstruction. She also had a gallbladder ejection fraction of 90% at 60 minutes. I will  order an MRI of abdomen and pelvis with and without contrast for further evaluation of the left kidney lesion as recommended and the right lower quadrant pain. Patient has an appointment with GI at [facility] in June. She wanted me to look at a spot on her right posterior shoulder and during examination this seems to be a cyst and appears benign. I will refer her to the dermatologist for further evaluation and possible removal. I will see her back after her MRI scan to discuss the findings and potentially stopping Anastrozole.   She denies signs of infection such as sore throat, sinus drainage, cough, or urinary symptoms.  She denies fevers or recurrent chills. She denies nausea, vomiting, chest pain, dyspnea or cough. Her appetite ok and her weight has decreased 2 pounds over last 8 months .   REVIEW OF SYSTEMS:  Review of Systems  Constitutional: Negative.  Negative for appetite change, chills, diaphoresis, fatigue, fever and unexpected weight change.  HENT:  Negative.  Negative for hearing loss, lump/mass, mouth sores, nosebleeds, sore throat, tinnitus, trouble swallowing and voice change.   Eyes: Negative.  Negative for eye problems and icterus.  Respiratory: Negative.  Negative for chest tightness, cough, hemoptysis, shortness of breath and wheezing.   Cardiovascular: Negative.  Negative for chest pain, leg swelling and palpitations.  Gastrointestinal:  Positive for abdominal pain (right lower quadrant abdominal pain). Negative for abdominal distention, blood in stool, constipation, diarrhea, nausea and vomiting.  Endocrine: Negative.   Genitourinary: Negative.  Negative for bladder incontinence, difficulty urinating, dyspareunia, dysuria, frequency, hematuria, menstrual problem, nocturia, pelvic pain, vaginal bleeding and vaginal discharge.   Musculoskeletal:  Positive for gait problem (unsteady, uses a cane to ambulate). Negative for arthralgias, back pain, flank pain, myalgias, neck pain and neck  stiffness.       Stinging right upper shoulder  Skin: Negative.  Negative for itching, rash and wound.  Neurological:  Positive for gait problem (unsteady, uses a cane to ambulate). Negative for dizziness, extremity weakness, headaches, light-headedness, numbness, seizures and speech difficulty.  Hematological: Negative.  Negative for adenopathy. Does not bruise/bleed easily.  Psychiatric/Behavioral:  Positive for sleep disturbance. Negative for confusion, decreased concentration, depression and suicidal ideas. The patient is not nervous/anxious.      VITALS:  Blood pressure (!) 156/99, pulse 91, temperature 98.1 F (36.7 C), temperature source Oral, resp. rate 18, height 5\' 6"  (1.676 m), weight 198 lb 9.6 oz (90.1 kg), SpO2 98%.  Wt Readings from Last 3 Encounters:  11/12/23 198 lb 9.6 oz (90.1 kg)  01/03/23 200 lb 8 oz (90.9 kg)  01/01/22 224 lb 12.8 oz (102 kg)    Body mass index is 32.05 kg/m.  Performance status (ECOG): 1 - Symptomatic but completely ambulatory  PHYSICAL EXAM:  Physical Exam Vitals and nursing note reviewed.  Constitutional:      General: She is not in acute distress.    Appearance: Normal appearance. She is normal weight. She is not ill-appearing, toxic-appearing or diaphoretic.  HENT:     Head: Normocephalic and atraumatic.     Right Ear: Tympanic membrane, ear canal and external ear normal. There is no impacted cerumen.     Left Ear: Tympanic membrane, ear canal and external ear normal. There is no impacted cerumen.  Nose: Nose normal. No congestion or rhinorrhea.     Mouth/Throat:     Mouth: Mucous membranes are moist.     Pharynx: Oropharynx is clear. No oropharyngeal exudate or posterior oropharyngeal erythema.  Eyes:     General: No scleral icterus.       Right eye: No discharge.        Left eye: No discharge.     Extraocular Movements: Extraocular movements intact.     Conjunctiva/sclera: Conjunctivae normal.     Pupils: Pupils are equal,  round, and reactive to light.  Neck:     Vascular: No carotid bruit.  Cardiovascular:     Rate and Rhythm: Normal rate and regular rhythm.     Pulses: Normal pulses.     Heart sounds: Normal heart sounds. No murmur heard.    No friction rub. No gallop.  Pulmonary:     Effort: Pulmonary effort is normal. No respiratory distress.     Breath sounds: Normal breath sounds.  Chest:     Comments: Bilateral mastectomies are negative.  Well healed scar in the left upper chest Abdominal:     General: Bowel sounds are normal. There is no distension.     Palpations: Abdomen is soft. There is no hepatomegaly, splenomegaly or mass.     Tenderness: There is abdominal tenderness in the right lower quadrant.  Musculoskeletal:        General: Normal range of motion.     Cervical back: Normal range of motion and neck supple. No rigidity or tenderness.     Right lower leg: No edema.     Left lower leg: No edema.  Lymphadenopathy:     Cervical: No cervical adenopathy.     Right cervical: No superficial, deep or posterior cervical adenopathy.    Left cervical: No superficial, deep or posterior cervical adenopathy.     Upper Body:     Right upper body: No supraclavicular, axillary or pectoral adenopathy.     Left upper body: No supraclavicular, axillary or pectoral adenopathy.  Skin:    General: Skin is warm and dry.     Comments: Small erythematous nodule measuring 2-3cm with a whitish center in the right posterior shoulder.   Neurological:     General: No focal deficit present.     Mental Status: She is alert and oriented to person, place, and time. Mental status is at baseline.  Psychiatric:        Mood and Affect: Mood normal.        Behavior: Behavior normal.        Thought Content: Thought content normal.        Judgment: Judgment normal.     LABS:      Latest Ref Rng & Units 06/21/2009    4:40 AM 06/20/2009    5:00 AM 06/14/2009   12:40 PM  CBC  WBC 4.0 - 10.5 K/uL 7.8  5.4  7.8    Hemoglobin 12.0 - 15.0 g/dL 16.1  09.6  04.5   Hematocrit 36.0 - 46.0 % 33.4  33.9  41.6   Platelets 150 - 400 K/uL 186  172  241       Latest Ref Rng & Units 06/21/2009    4:40 AM 06/20/2009    5:00 AM 06/14/2009   12:40 PM  CMP  Glucose 70 - 99 mg/dL 409  811  914   BUN 6 - 23 mg/dL 11  13  19    Creatinine 0.4 - 1.2  mg/dL 8.65  7.84  6.96   Sodium 135 - 145 mEq/L 140  140  144   Potassium 3.5 - 5.1 mEq/L 3.5  3.7  3.9   Chloride 96 - 112 mEq/L 102  105  104   CO2 19 - 32 mEq/L 32  27  32   Calcium 8.4 - 10.5 mg/dL 8.4  8.1  9.2   Total Protein 6.0 - 8.3 g/dL   7.1   Total Bilirubin 0.3 - 1.2 mg/dL   0.3   Alkaline Phos 39 - 117 U/L   85   AST 0 - 37 U/L   20   ALT 0 - 35 U/L   23   No results found for: "IRON", "TIBC", "FERRITIN" No results found for: "TSH", "T3TOTAL", "T4TOTAL", "THYROIDAB"  STUDIES:     HISTORY:   Allergies:  Allergies  Allergen Reactions   Metformin Other (See Comments)    Other reaction(s): Other (See Comments)  dysegusia  dysegusia / pt. states she isn't allergic can't tolerate the smell    Current Medications: Current Outpatient Medications  Medication Sig Dispense Refill   ALPRAZolam (XANAX) 0.25 MG tablet Take by mouth.     amLODipine (NORVASC) 5 MG tablet Take 5 mg by mouth daily.     anastrozole (ARIMIDEX) 1 MG tablet TAKE ONE TABLET BY MOUTH EVERY DAY 90 tablet 5   atorvastatin (LIPITOR) 80 MG tablet Take 80 mg by mouth daily.     clonazePAM (KLONOPIN) 1 MG tablet Take by mouth.     empagliflozin (JARDIANCE) 25 MG TABS tablet Take 25 mg by mouth daily.     fluconazole (DIFLUCAN) 100 MG tablet Take 1 tablet by mouth as needed (sclerosis).     furosemide (LASIX) 40 MG tablet Take 40 mg by mouth daily.     glimepiride (AMARYL) 4 MG tablet Take 4 mg by mouth daily with breakfast.     levothyroxine (SYNTHROID, LEVOTHROID) 150 MCG tablet Take 150 mcg by mouth daily.      metFORMIN (GLUCOPHAGE) 500 MG tablet Take 1,000 mg by mouth 2  (two) times daily.     traZODone (DESYREL) 50 MG tablet Take 50 mg by mouth at bedtime as needed for sleep.     trospium (SANCTURA) 20 MG tablet Take 20 mg by mouth 2 (two) times daily.     valsartan (DIOVAN) 160 MG tablet Take 160 mg by mouth 2 (two) times daily.      No current facility-administered medications for this visit.    ASSESSMENT & PLAN:  Assessment:   1. Remote history of stage IA triple negative breast cancer diagnosed in June 2002.   2. Stage IA hormone receptor positive right breast, diagnosed in June 2020, treated with bilateral mastectomies. She is on adjuvant hormonal therapy with anastrozole 1 mg daily and is tolerating that well.   3. Personal and family history of malignancy.  Genetic testing did not reveal any clinically significant mutations or variant of uncertain significance.     4. 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  5. Osteopenia. This was diagnosed on bone density scan in July, 2023 and is mild with a T-score of -1.4 of the right femur. The forearm is normal.   6. Right Lower Quadrant Pain. This has persisted for over 6 months and Dr. Mcdonald Speller has done a thorough evaluation. I think she may need a colonoscopy, and does have an  appointment with a gastroenterologist. I will order an MRI of the abdomen and pelvis.   7. Lesion of the left kidney seen on CT scan in October, 2024. It was recommended that this be evaluated further with an MRI scan.   8. Tiny skin lesion of the right shoulder. I have reassured her that this is not anything to be concerned about but she is clearly anxious and wants to have it removed.   Plan:     She went to Dr. Mcdonald Speller for further evaluation of her abdominal right lower quadrant pain and he ordered a CT abdomen and pelvis. CT abdomen and pelvis done on 04/30/2024 revealed a mildly distended gallbladder, indeterminate low-density  structure in the left kidney interpolar region measuring 2.4cm. She had further imaging done and ultrasound of the abdomen done on 05/22/2023 that revealed no cholelithiasis or evidence of cholecystitis. NM biliary imaging done on 06/03/2023 revealed no hepatobiliary findings typically associated with acute cholecystitis or biliary obstruction. She also had a gallbladder ejection fraction of 90% at 60 minutes. I will order an MRI of abdomen and pelvis with and without contrast for further evaluation of the left kidney lesion as recommended, and the right lower quadrant pain. Patient has an appointment with GI at [facility] in June. She wanted me to look at a spot on her right posterior shoulder and during examination this seems to be a cyst.  It appears to be benign and I attempted to reassure her but she wants it removed and is clearly anxious about it.  I will refer her to the dermatologist for further evaluation and possible removal. I will see her back after her MRI scan to discuss the findings and potentially stopping Anastrozole. The patient understands the plans discussed today and is in agreement with them.  She knows to contact our office if she develops concerns prior to her next appointment.  I provided 18 minutes of face-to-face time during this this encounter and > 50% was spent counseling as documented under my assessment and plan.   Nolia Baumgartner, MD  [facility] [address] [place] [address] Dept: [phone] Dept Fax: [phone]   No orders of the defined types were placed in this encounter.  I,Jasmine M Lassiter,acting as a scribe for Nolia Baumgartner, MD.,have documented all relevant documentation on the behalf of Nolia Baumgartner, MD,as directed by  Nolia Baumgartner, MD while in the presence of Nolia Baumgartner, MD.

## 2023-11-13 ENCOUNTER — Other Ambulatory Visit: Payer: Self-pay | Admitting: Oncology

## 2023-11-13 DIAGNOSIS — N2889 Other specified disorders of kidney and ureter: Secondary | ICD-10-CM

## 2023-11-13 DIAGNOSIS — R1031 Right lower quadrant pain: Secondary | ICD-10-CM

## 2023-11-14 ENCOUNTER — Telehealth: Payer: Self-pay

## 2023-11-14 NOTE — Telephone Encounter (Signed)
-----   Message from Nolia Baumgartner sent at 11/12/2023  6:16 PM EDT ----- Regarding: refer Ref Derm for tiny lesion right shoulder, probably cyst

## 2023-11-14 NOTE — Telephone Encounter (Signed)
 Referral sent to [facility].

## 2023-11-14 NOTE — Addendum Note (Signed)
 Addended by: Delena Casebeer M on: 11/14/2023 11:09 AM   Modules accepted: Orders

## 2023-11-27 ENCOUNTER — Ambulatory Visit (HOSPITAL_BASED_OUTPATIENT_CLINIC_OR_DEPARTMENT_OTHER)
Admission: RE | Admit: 2023-11-27 | Discharge: 2023-11-27 | Disposition: A | Discharge status: Home / Self Care | Source: Ambulatory Visit | Attending: Oncology | Admitting: Oncology

## 2023-11-27 DIAGNOSIS — N2889 Other specified disorders of kidney and ureter: Secondary | ICD-10-CM

## 2023-11-27 DIAGNOSIS — R1031 Right lower quadrant pain: Secondary | ICD-10-CM

## 2023-11-27 MED ORDER — GADOBUTROL 1 MMOL/ML IV SOLN
9.0000 mL | Freq: Once | INTRAVENOUS | Status: AC | PRN
  Administered 2023-11-27: 9 mL via INTRAVENOUS

## 2023-12-04 ENCOUNTER — Encounter: Payer: Self-pay | Admitting: Oncology

## 2023-12-04 ENCOUNTER — Telehealth: Payer: Self-pay | Admitting: Oncology

## 2023-12-04 ENCOUNTER — Other Ambulatory Visit: Payer: Self-pay | Admitting: Oncology

## 2023-12-04 ENCOUNTER — Inpatient Hospital Stay: Attending: Oncology | Admitting: Oncology

## 2023-12-04 VITALS — BP 132/88 | HR 97 | Temp 98.6°F | Resp 18 | Ht 66.0 in | Wt 199.8 lb

## 2023-12-04 DIAGNOSIS — Z171 Estrogen receptor negative status [ER-]: Secondary | ICD-10-CM | POA: Diagnosis not present

## 2023-12-04 DIAGNOSIS — Z170 Estrogen receptor positive status [ER+]: Secondary | ICD-10-CM | POA: Diagnosis not present

## 2023-12-04 DIAGNOSIS — C50911 Malignant neoplasm of unspecified site of right female breast: Secondary | ICD-10-CM | POA: Diagnosis not present

## 2023-12-04 DIAGNOSIS — Z79811 Long term (current) use of aromatase inhibitors: Secondary | ICD-10-CM | POA: Diagnosis not present

## 2023-12-04 DIAGNOSIS — Z780 Asymptomatic menopausal state: Secondary | ICD-10-CM

## 2023-12-04 DIAGNOSIS — R1031 Right lower quadrant pain: Secondary | ICD-10-CM

## 2023-12-04 MED ORDER — DICYCLOMINE HCL 10 MG PO CAPS: 10.0000 mg | ORAL_CAPSULE | Freq: Three times a day (TID) | ORAL | 0 refills | Status: AC

## 2023-12-04 NOTE — Telephone Encounter (Signed)
 Patient has been scheduled for follow-up visit per 12/04/23 LOS.  Pt given an appt calendar with date and time.

## 2023-12-04 NOTE — Progress Notes (Signed)
 [facility]  [address] [place],  [place]  [address] [phone]  Clinic Day: 12/04/2023  Referring physician: Madlyn Schirmer, MD  CHIEF COMPLAINT:  CC: Stage IA hormone receptor positive breast cancer  Current Treatment: Surveillance  HISTORY OF PRESENT ILLNESS:  [name] is a 83 y.o. 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 She was treated with lumpectomy.  Pathology revealed a 1.8 cm, high grade, infiltrating ductal carcinoma.  Sentinel node was negative for metastasis.  Estrogen and progesterone receptors were negative and her 2 Neu negative.  She received adjuvant chemotherapy with Adriamycin and Cytoxan for 4 cycles, followed by postoperative radiation to the right breast.  Due to her personal and family history of cancer, we recommended testing for hereditary breast and ovarian cancer, but she declined.  She was found to have a new stage IA (Tlb N0 M0) hormone receptor positive right breast cancer in June 2020. Routine mammogram revealed a mass int the right breast.  Biopsy revealed grade 2, invasive ductal carcinoma.  Estrogen and progesterone receptors were highly positive.  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  She was referred her to Dr. Jonita Neth for right mastectomy.  She requested  bilateral mastectomies, as this was her second breast cancer.  She underwent bilateral mastectomies in July 2020.  Surgical pathology revealed a 9 mm invasive ductal carcinoma, grade 2, in her right breast.  One sentinel lymph node was tested and was negative for metastatic carcinoma (0/1).  Estrogen and progesterone receptors were 90% positive and HER2 negative.  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  We recommended adjuvant endocrine therapy with anastrozole 1 mg daily for a total of 5 years, which was started in August 2020.  Bone density scan in August 2020 was  normal.  She underwent testing for hereditary breast cancer with the [facility] Hereditary Cancer panel test in July 2020.  This did not reveal any clinically significant mutation or variants of uncertain significance.  Bone scan in May 2021 did not reveal any evidence of malignancy.  There was periarticular uptake about both knees related to bilateral knee arthroplasty. There was increased uptake in the left shoulder, felt to be likely degenerative. She has a history of right shoulder replacement.  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 She informed me that her husband passed in September, 2023 after 2 weeks of a newly diagnosed Leukemia. He had just finished treatment for lung cancer. She continues anastrozole without significant difficulty and will be ready to complete her 5 years this summer. She was seen in April for right lower quadrant pain and had had extensive evaluation by Dr. Mcdonald Speller which was negative. CT report revealed a lesion in her left kidney which was indeterminate so I ordered a MRI of abdomen and pelvis to evaluate this as well as her right lower quadrant pain. She had a MRI of abdomen and pelvis done on 11/27/2023 that revealed no suspicious renal mass, but a bilobed simple cyst arising from the left kidney interpolar region laterally measuring 2.5 x 3.6 cm, no other focal lesions seen within the bilateral kidneys, and multiple other nonacute observations, as described above. Since her MRI scan was normal I suggested she may  need a colonoscopy to further evaluate her abdominal pain. Patient already has a GI appointment on 12/29/2023 and I wrote this information down from her. I am concerned that she is somewhat forgetful and did not know whether she is still taking the anastrozole or not, it has been nearly 5 years so I will have  her stop it. She states that tramadol has not helped her pain and I will prescribe bentyl  10 mg QID for her stomach pain to see if it will help. Patient states that she feels relieved after finding her purse she had lost this morning. Her BP was 170/110 due to the stress and when rechecked was 132/88. Her only complaints of pain are in her right lower abdomen. She will be due for a bone density scan in July but I think we can wait until next year since the prior one was nearly normal and she is stopping the anastrozole. I will see her back in 1 year with CBC, CMP, and repeat bone density scan. She denies fever, chills, night sweats, or other signs of infection. She denies cardiorespiratory and gastrointestinal issues. Her appetite is good and her weight has increased 1 pounds over last 4 weeks.   REVIEW OF SYSTEMS:  Review of Systems  Constitutional: Negative.  Negative for appetite change, chills, diaphoresis, fatigue, fever and unexpected weight change.  HENT:  Negative.  Negative for hearing loss, lump/mass, mouth sores, nosebleeds, sore throat, tinnitus, trouble swallowing and voice change.   Eyes: Negative.  Negative for eye problems and icterus.  Respiratory: Negative.  Negative for chest tightness, cough, hemoptysis, shortness of breath and wheezing.   Cardiovascular: Negative.  Negative for chest pain, leg swelling and palpitations.  Gastrointestinal:  Positive for abdominal pain (right lower quadrant abdominal pain). Negative for abdominal distention, blood in stool, constipation, diarrhea, nausea and vomiting.  Endocrine: Negative.   Genitourinary: Negative.  Negative for bladder incontinence, difficulty urinating, dyspareunia, dysuria, frequency, hematuria, menstrual problem, nocturia, pelvic pain, vaginal bleeding and vaginal discharge.   Musculoskeletal:  Positive for gait problem (unsteady, uses a cane to ambulate). Negative for arthralgias, back pain, flank pain, myalgias, neck pain and  neck stiffness.  Skin: Negative.  Negative for itching, rash and wound.  Neurological:  Positive for gait problem (unsteady, uses a cane to ambulate). Negative for dizziness, extremity weakness, headaches, light-headedness, numbness, seizures and speech difficulty.  Hematological: Negative.  Negative for adenopathy. Does not bruise/bleed easily.  Psychiatric/Behavioral:  Positive for sleep disturbance. Negative for confusion, decreased concentration, depression and suicidal ideas. The patient is nervous/anxious.        Memory impairment    VITALS:  Blood pressure 132/88, pulse 97, temperature 98.6 F (37 C), temperature source Oral, resp. rate 18, height 5\' 6"  (1.676 m), weight 199 lb 12.8 oz (90.6 kg), SpO2 97%.  Wt Readings from Last 3 Encounters:  12/04/23 199 lb 12.8 oz (90.6 kg)  11/12/23 198 lb 9.6 oz (90.1 kg)  01/03/23 200 lb 8 oz (90.9 kg)    Body mass index is 32.25 kg/m.  Performance status (ECOG): 1 - Symptomatic but completely ambulatory  PHYSICAL EXAM:  Physical Exam Vitals and nursing note reviewed.  Constitutional:      General: She is not in acute distress.    Appearance: Normal appearance. She is normal weight. She is not ill-appearing, toxic-appearing or diaphoretic.  HENT:     Head: Normocephalic and atraumatic.     Right Ear: Tympanic membrane, ear canal and external ear normal. There is  no impacted cerumen.     Left Ear: Tympanic membrane, ear canal and external ear normal. There is no impacted cerumen.     Nose: Nose normal. No congestion or rhinorrhea.     Mouth/Throat:     Mouth: Mucous membranes are moist.     Pharynx: Oropharynx is clear. No oropharyngeal exudate or posterior oropharyngeal erythema.  Eyes:     General: No scleral icterus.       Right eye: No discharge.        Left eye: No discharge.     Extraocular Movements: Extraocular movements intact.     Conjunctiva/sclera: Conjunctivae normal.     Pupils: Pupils are equal, round, and reactive  to light.  Neck:     Vascular: No carotid bruit.  Cardiovascular:     Rate and Rhythm: Normal rate and regular rhythm.     Pulses: Normal pulses.     Heart sounds: Normal heart sounds. No murmur heard.    No friction rub. No gallop.  Pulmonary:     Effort: Pulmonary effort is normal. No respiratory distress.     Breath sounds: Normal breath sounds.  Chest:     Comments: Bilateral mastectomies are negative Abdominal:     General: Bowel sounds are normal. There is no distension.     Palpations: Abdomen is soft. There is no hepatomegaly, splenomegaly or mass.     Tenderness: There is abdominal tenderness in the right lower quadrant.  Musculoskeletal:        General: Normal range of motion.     Cervical back: Normal range of motion and neck supple. No rigidity or tenderness.     Right lower leg: No edema.     Left lower leg: No edema.  Lymphadenopathy:     Cervical: No cervical adenopathy.     Right cervical: No superficial, deep or posterior cervical adenopathy.    Left cervical: No superficial, deep or posterior cervical adenopathy.     Upper Body:     Right upper body: No supraclavicular, axillary or pectoral adenopathy.     Left upper body: No supraclavicular, axillary or pectoral adenopathy.  Skin:    General: Skin is warm and dry.     Comments: Small erythematous nodule measuring 2-3cm with a whitish center in the right posterior shoulder.   Neurological:     General: No focal deficit present.     Mental Status: She is alert and oriented to person, place, and time. Mental status is at baseline.  Psychiatric:        Mood and Affect: Mood normal.        Behavior: Behavior normal.        Thought Content: Thought content normal.        Judgment: Judgment normal.     LABS:      Latest Ref Rng & Units 06/21/2009    4:40 AM 06/20/2009    5:00 AM 06/14/2009   12:40 PM  CBC  WBC 4.0 - 10.5 K/uL 7.8  5.4  7.8   Hemoglobin 12.0 - 15.0 g/dL 08.6  57.8  46.9   Hematocrit 36.0  - 46.0 % 33.4  33.9  41.6   Platelets 150 - 400 K/uL 186  172  241       Latest Ref Rng & Units 06/21/2009    4:40 AM 06/20/2009    5:00 AM 06/14/2009   12:40 PM  CMP  Glucose 70 - 99 mg/dL 629  528  413  BUN 6 - 23 mg/dL 11  13  19    Creatinine 0.4 - 1.2 mg/dL 4.78  2.95  6.21   Sodium 135 - 145 mEq/L 140  140  144   Potassium 3.5 - 5.1 mEq/L 3.5  3.7  3.9   Chloride 96 - 112 mEq/L 102  105  104   CO2 19 - 32 mEq/L 32  27  32   Calcium 8.4 - 10.5 mg/dL 8.4  8.1  9.2   Total Protein 6.0 - 8.3 g/dL   7.1   Total Bilirubin 0.3 - 1.2 mg/dL   0.3   Alkaline Phos 39 - 117 U/L   85   AST 0 - 37 U/L   20   ALT 0 - 35 U/L   23   No results found for: "IRON", "TIBC", "FERRITIN" No results found for: "TSH", "T3TOTAL", "T4TOTAL", "THYROIDAB"  STUDIES:  EXAM: 11/27/2023 MRI ABDOMEN AND PELVIS WITHOUT AND WITH CONTRAST IMPRESSION: 1. No suspicious renal mass. There is a bilobed simple cyst arising from the left kidney interpolar region, laterally measuring 2.5 x 3.6 cm, which corresponds to the lesion described on the prior CT scan abdomen and pelvis from 05/01/2023. No other focal lesions seen within the bilateral kidneys. 2. Multiple other nonacute observations, as described above.       HISTORY:   Allergies:  Allergies  Allergen Reactions   Metformin Other (See Comments)    Other reaction(s): Other (See Comments)  dysegusia  dysegusia / pt. states she isn't allergic can't tolerate the smell    Current Medications: Current Outpatient Medications  Medication Sig Dispense Refill   ALPRAZolam (XANAX) 0.25 MG tablet Take by mouth.     amLODipine (NORVASC) 5 MG tablet Take 5 mg by mouth daily.     anastrozole (ARIMIDEX) 1 MG tablet TAKE ONE TABLET BY MOUTH EVERY DAY 90 tablet 5   atorvastatin (LIPITOR) 80 MG tablet Take 80 mg by mouth daily.     clonazePAM (KLONOPIN) 1 MG tablet Take by mouth.     dicyclomine  (BENTYL ) 10 MG capsule Take 1 capsule (10 mg total) by mouth 4  (four) times daily -  before meals and at bedtime. 60 capsule 0   empagliflozin (JARDIANCE) 25 MG TABS tablet Take 25 mg by mouth daily.     fluconazole (DIFLUCAN) 100 MG tablet Take 1 tablet by mouth as needed (sclerosis).     furosemide (LASIX) 40 MG tablet Take 40 mg by mouth daily.     glimepiride (AMARYL) 4 MG tablet Take 4 mg by mouth daily with breakfast.     levothyroxine (SYNTHROID, LEVOTHROID) 150 MCG tablet Take 150 mcg by mouth daily.      metFORMIN (GLUCOPHAGE) 500 MG tablet Take 1,000 mg by mouth 2 (two) times daily.     traZODone (DESYREL) 50 MG tablet Take 50 mg by mouth at bedtime as needed for sleep.     trospium (SANCTURA) 20 MG tablet Take 20 mg by mouth 2 (two) times daily.     valsartan (DIOVAN) 160 MG tablet Take 160 mg by mouth 2 (two) times daily.      No current facility-administered medications for this visit.   ASSESSMENT & PLAN:  Assessment:   Remote history of stage IA triple negative breast cancer diagnosed in June 2002.   Stage IA hormone receptor positive right breast, diagnosed in June 2020, treated with bilateral mastectomies. She is on adjuvant hormonal therapy with anastrozole 1 mg daily and is tolerating that  well.  She will now complete her 5 years and stop it.   Personal and family history of malignancy.  Genetic testing did not reveal any clinically significant mutations or variant of uncertain significance.    Osteopenia. This was diagnosed on bone density scan in July, 2023 and is mild with a T-score of -1.4 of the right femur. The forearm is normal.  We can probably wait until next year to repeat this.  Right Lower Quadrant Pain. This has persisted for over 6 months and Dr. Mcdonald Speller has done a thorough evaluation. I think she may need a colonoscopy, and does have an appointment with a gastroenterologist in June. MRI of the abdomen and pelvis was negative.   Lesion of the left kidney seen on CT scan in October, 2024. It was recommended that this be  evaluated further with an MRI scan. This was found to be a simple benign cyst.   Plan:     She was seen in April for right lower quadrant pain and had had extensive evaluation by Dr. Mcdonald Speller which was negative. CT report revealed a lesion in her left kidney which was indeterminate so I ordered a MRI of abdomen and pelvis to evaluate this as well as her right lower quadrant pain. She had a MRI of abdomen and pelvis done on 11/27/2023 that revealed no suspicious renal mass, but a bilobed simple cyst arising from the left kidney interpolar region laterally measuring 2.5 x 3.6 cm, no other focal lesions seen within the bilateral kidneys, and multiple other nonacute observations, as described above. Since her MRI scan was normal I suggested she may need a colonoscopy to further evaluate her abdominal pain. Patient already has a GI appointment on 12/29/2023 and I wrote this information down for her. I am concerned that she is somewhat forgetful and did not know whether she is still taking the anastrozole or not, it has been nearly 5 years so I will have her stop it. She states that tramadol has not helped her pain and I will prescribe bentyl  10 mg QID for her stomach pain to see if it will help. Patient states that she feels relieved after finding her purse she had lost this morning. Her BP was 170/110 due to the stress and when rechecked 132/88. Her only complaints of pain are in her right lower abdomen. She will be due for a bone density scan in July but I think we can wait until next year since the prior one was nearly normal and she is stopping the anastrozole.  I will see her back in 1 year with CBC, CMP, and repeat bone density scan.  The patient understands the plans discussed today and is in agreement with them.  She knows to contact our office if she develops concerns prior to her next appointment.   I provided 23 minutes of face-to-face time during this this encounter and > 50% was spent counseling as  documented under my assessment and plan.   Jamie Baumgartner, MD  [name] - A DEPT OF [name] [address] [place] [address] Dept: [phone] Dept Fax: [phone]   No orders of the defined types were placed in this encounter.  I,Jasmine M Lassiter,acting as a scribe for Jamie Baumgartner, MD.,have documented all relevant documentation on the behalf of Jamie Baumgartner, MD,as directed by  Jamie Baumgartner, MD while in the presence of Jamie Baumgartner, MD.

## 2023-12-29 ENCOUNTER — Encounter: Payer: Self-pay | Admitting: Gastroenterology

## 2023-12-29 ENCOUNTER — Ambulatory Visit (INDEPENDENT_AMBULATORY_CARE_PROVIDER_SITE_OTHER): Admitting: Gastroenterology

## 2023-12-29 VITALS — BP 154/92 | HR 79 | Ht 66.0 in | Wt 196.2 lb

## 2023-12-29 DIAGNOSIS — R1031 Right lower quadrant pain: Secondary | ICD-10-CM

## 2023-12-29 DIAGNOSIS — R194 Change in bowel habit: Secondary | ICD-10-CM | POA: Diagnosis not present

## 2023-12-29 MED ORDER — HYOSCYAMINE SULFATE 0.125 MG SL SUBL
0.1250 mg | SUBLINGUAL_TABLET | Freq: Three times a day (TID) | SUBLINGUAL | 0 refills | Status: AC | PRN
Start: 2023-12-29 — End: ?

## 2023-12-29 NOTE — Patient Instructions (Addendum)
 Recommend over the counter Salonpas topical patch to see if it helps  Recommend over the counter Benefiber gummies  [facility] hyoscyamine 0.125mg  prn for abdominal pain+ Recommend colonoscopy to further evaluate, she would like to think about it    _______________________________________________________  If your blood pressure at your visit was 140/90 or greater, please contact your primary care physician to follow up on this.  _______________________________________________________  If you are age 60 or older, your body mass index should be between 23-30. Your Body mass index is 31.68 kg/m. If this is out of the aforementioned range listed, please consider follow up with your Primary Care Provider.  If you are age 48 or younger, your body mass index should be between 19-25. Your Body mass index is 31.68 kg/m. If this is out of the aformentioned range listed, please consider follow up with your Primary Care Provider.   ________________________________________________________  The [facility] providers would like to encourage you to use MYCHART to communicate with providers for non-urgent requests or questions.  Due to long hold times on the telephone, sending your provider a message by [facility] may be a faster and more efficient way to get a response.  Please allow 48 business hours for a response.  Please remember that this is for non-urgent requests.  _______________________________________________________   Thank you for trusting me with your gastrointestinal care. Deanna May, RNP

## 2023-12-29 NOTE — Progress Notes (Signed)
 Chief Complaint:abdominal pain, RLQ Primary GI Doctor:Dr. Venice Gillis  HPI:  Patient is a  83  year old female patient with past medical history of OSA, hypertension, DM2, breast CA, and hypothyroid, who was referred to me by Madlyn Schirmer, MD on 10/10/23 for second opinion on  a complaint of abdominal pain, RLQ .    Seen by GYN/urology Dr. Milderd Alken and treated for over active bladder and urge incontinence.  Seen by GI Merlene Stark, MD on 08/18/23 for RLQ abdominal pain. MSK vs colonic pathology? Recommendations for colonoscopy to r/o colon CA.   Imaging --11/27/23 MR Abd/pelvis W WO contrast IMPRESSION: 1. No suspicious renal mass. There is a bilobed simple cyst arising from the left kidney interpolar region, laterally measuring 2.5 x 3.6 cm, which corresponds to the lesion described on the prior CT scan abdomen and pelvis from 05/01/2023. No other focal lesions seen within the bilateral kidneys. 2. Multiple other nonacute observations, as described above. --04/30/23 CT ABD/pelvis Impression No acute abnormality in the abdomen or pelvis Gallbladder is mildly distended without clear evidence of inflammatory changes.  If this is a area of concern, recommend right upper quadrant ultrasound for further characterization. Incidental finding of potential clinical significance has been found indeterminate low-density structure in the left kidney interpolar region.  This is not compatible with a simple cyst.  Recommend MRI. Abdominal wall laxity in the right lower quadrant.  There are loops of bowel in this area.  No inflammatory changes in this area.  This finding could be contributing to patient's right lower quadrant pain. --05/22/23 US  Abd RUQ Impression: no cholelithiasis or evidence of cholecystitis.  Interval History    Patient presents for evaluation of chronic RLQ pelvic pain that started last September. She reports it is 5/10 on pain scale. Described as sharp and constant. She  cannot pinpoint any cause of the symptoms.No new medications.  She states her bowels consist of one unformed stool per day. No blood in stool.She has tried dicyclomine  four times a day without improvement in symptoms.  Denies back or hip pain. Denies numbness or tingling down leg.  No gas or bloating.   Patient denies GERD or dysphagia. Patient denies nausea, vomiting, or weight loss.   Patient reports she stays busy at home with hobbies including weaving and quilting.  She is a widow with no close family. She sleeps on the sofa, due to her husband passing in their bed.   She uses a cane to get around.  Patients last colonoscopy approximately 10 years ago, and per patient normal.  Patient's family history includes mother with stroke, father with CA (unknown kind), two brothers with CA (unknown kind).  Wt Readings from Last 3 Encounters:  12/29/23 196 lb 4 oz (89 kg)  12/04/23 199 lb 12.8 oz (90.6 kg)  11/12/23 198 lb 9.6 oz (90.1 kg)     Past Medical History:  Diagnosis Date   Atypical chest pain 03/19/2017   Cancer (HCC)    breast, 2002   Chronic kidney disease, stage 3 (HCC) 07/14/2016   CKD (chronic kidney disease)    stage 3   Diabetes mellitus without complication (HCC)    type 2   Gait disorder    History of breast cancer 07/14/2016   Hypercholesteremia    Hypertension    Morbid obesity with BMI of 40.0-44.9, adult (HCC) 10/27/2014   OSA on CPAP    Osteoarthrosis    Recurrent major depressive disorder, in remission (HCC) 01/11/2016  Urinary incontinence     Past Surgical History:  Procedure Laterality Date   ABDOMINAL HYSTERECTOMY  1976   total   BREAST LUMPECTOMY Right 2002   chemo, radiation   CARPAL TUNNEL RELEASE Right    CATARACT EXTRACTION, BILATERAL  2018   FOOT TENDON SURGERY Right 1990   REPLACEMENT TOTAL KNEE     bilateral   TONSILLECTOMY     TOTAL SHOULDER REPLACEMENT Right    WISDOM TOOTH EXTRACTION      Current Outpatient Medications   Medication Sig Dispense Refill   ALPRAZolam (XANAX) 0.25 MG tablet Take by mouth.     amLODipine (NORVASC) 5 MG tablet Take 5 mg by mouth daily.     anastrozole (ARIMIDEX) 1 MG tablet TAKE ONE TABLET BY MOUTH EVERY DAY 90 tablet 5   atorvastatin (LIPITOR) 80 MG tablet Take 80 mg by mouth daily.     clonazePAM (KLONOPIN) 1 MG tablet Take by mouth.     dicyclomine  (BENTYL ) 10 MG capsule Take 1 capsule (10 mg total) by mouth 4 (four) times daily -  before meals and at bedtime. 60 capsule 0   empagliflozin (JARDIANCE) 25 MG TABS tablet Take 25 mg by mouth daily.     fluconazole (DIFLUCAN) 100 MG tablet Take 1 tablet by mouth as needed (sclerosis).     furosemide (LASIX) 40 MG tablet Take 40 mg by mouth daily.     glimepiride (AMARYL) 4 MG tablet Take 4 mg by mouth daily with breakfast.     hyoscyamine (LEVSIN SL) 0.125 MG SL tablet Place 1 tablet (0.125 mg total) under the tongue every 8 (eight) hours as needed for cramping. 30 tablet 0   levothyroxine (SYNTHROID, LEVOTHROID) 150 MCG tablet Take 150 mcg by mouth daily.      metFORMIN (GLUCOPHAGE) 500 MG tablet Take 1,000 mg by mouth 2 (two) times daily.     traZODone (DESYREL) 50 MG tablet Take 50 mg by mouth at bedtime as needed for sleep.     trospium (SANCTURA) 20 MG tablet Take 20 mg by mouth 2 (two) times daily.     valsartan (DIOVAN) 160 MG tablet Take 160 mg by mouth 2 (two) times daily.      No current facility-administered medications for this visit.    Allergies as of 12/29/2023 - Review Complete 12/29/2023  Allergen Reaction Noted   Metformin Other (See Comments) 09/06/2015    Family History  Problem Relation Age of Onset   Stroke Mother    Hypertension Mother    Heart failure Father    Diabetes Father    Cancer Sister        breast   Memory loss Sister    Cancer Brother        larynx   Cancer Other        leukkemia, GYN cancer   Stroke Brother    Heart attack Brother     Review of Systems:    Constitutional: No  weight loss, fever, chills, weakness or fatigue HEENT: Eyes: No change in vision               Ears, Nose, Throat:  No change in hearing or congestion Skin: No rash or itching Cardiovascular: No chest pain, chest pressure or palpitations   Respiratory: No SOB or cough Gastrointestinal: See HPI and otherwise negative Genitourinary: No dysuria or change in urinary frequency Neurological: No headache, dizziness or syncope Musculoskeletal: No new muscle or joint pain Hematologic: No bleeding or bruising Psychiatric:  No history of depression or anxiety    Physical Exam:  Vital signs: BP (!) 154/92 (BP Location: Left Arm, Patient Position: Sitting, Cuff Size: Normal)   Pulse 79   Ht 5\' 6"  (1.676 m)   Wt 196 lb 4 oz (89 kg)   BMI 31.68 kg/m   Constitutional:   Pleasant female appears to be in NAD, Well developed, Well nourished, alert and cooperative Throat: Oral cavity and pharynx without inflammation, swelling or lesion.  Respiratory: Respirations even and unlabored. Lungs clear to auscultation bilaterally.   No wheezes, crackles, or rhonchi.  Cardiovascular: Normal S1, S2. Regular rate and rhythm. No peripheral edema, cyanosis or pallor.  Gastrointestinal:  Soft,obese, nondistended, tender RLQ in pelvic region. No rebound or guarding. Normal bowel sounds. No appreciable masses or hepatomegaly. Rectal:  Not performed.  Msk:  uses cane, unsteady on feet Neurologic:  Alert and  oriented x4;  grossly normal neurologically.  Skin:   Dry and intact without significant lesions or rashes. Psychiatric: Oriented to person, place and time. Demonstrates good judgement and reason without abnormal affect or behaviors.  RELEVANT LABS AND IMAGING: CBC    Latest Ref Rng & Units 06/21/2009    4:40 AM 06/20/2009    5:00 AM 06/14/2009   12:40 PM  CBC  WBC 4.0 - 10.5 K/uL 7.8  5.4  7.8   Hemoglobin 12.0 - 15.0 g/dL 16.1  09.6  04.5   Hematocrit 36.0 - 46.0 % 33.4  33.9  41.6   Platelets 150 - 400  K/uL 186  172  241      CMP     Latest Ref Rng & Units 06/21/2009    4:40 AM 06/20/2009    5:00 AM 06/14/2009   12:40 PM  CMP  Glucose 70 - 99 mg/dL 409  811  914   BUN 6 - 23 mg/dL 11  13  19    Creatinine 0.4 - 1.2 mg/dL 7.82  9.56  2.13   Sodium 135 - 145 mEq/L 140  140  144   Potassium 3.5 - 5.1 mEq/L 3.5  3.7  3.9   Chloride 96 - 112 mEq/L 102  105  104   CO2 19 - 32 mEq/L 32  27  32   Calcium 8.4 - 10.5 mg/dL 8.4  8.1  9.2   Total Protein 6.0 - 8.3 g/dL   7.1   Total Bilirubin 0.3 - 1.2 mg/dL   0.3   Alkaline Phos 39 - 117 U/L   85   AST 0 - 37 U/L   20   ALT 0 - 35 U/L   23      Assessment: Encounter Diagnoses  Name Primary?   Altered bowel habits Yes   RLQ abdominal pain      Patient has had full evaluation of RLQ pain with abd US , CT scan, and MRI.  CT shows Abdominal wall laxity in the right lower quadrant. ? Maybe benefit from abdominal binder? Physical therapy?  Patient reports the pain is constant without any other contributing GI symptoms. No improvement with dicyclomine  before meals. We discussed a high fiber diet and Benefiber to help form her stools. She does not like powder will try gummies or capsules. Will send short script anticholingeric hyoscyamine to see if this helps if it is spastic in nature which typically comes and goes is not constant.  We discussed colonoscopy which is probably low yield. She would need to be done at hospital as she seems unstable  on feet.      Patient did seem very tearful when discussing her husband passing two years ago and not having any close friends/family near by checking on her. I mentioned she Damarrion Mimbs benefit from grief counseling, however patient declined. Listened to all of her questions/concerns which seemed to help some.  Plan: -Recommend trying Salonpas topical patch prn pain -Recommend Benefiber gummies po daily -Send hyoscyamine 0.125mg  prn for abdominal pain, short script -Recommend colonoscopy to further evaluate,  declines today. -Follow-up with Dr. Venice Gillis in 2 mths   Thank you for the courtesy of this consult. Please call me with any questions or concerns.   Ana Liaw, FNP-C [facility] 12/29/2023, 12:41 PM  Cc: Madlyn Schirmer, MD

## 2024-01-02 ENCOUNTER — Inpatient Hospital Stay: Payer: Medicare PPO | Admitting: Oncology

## 2024-01-26 ENCOUNTER — Ambulatory Visit (HOSPITAL_BASED_OUTPATIENT_CLINIC_OR_DEPARTMENT_OTHER)
Admission: RE | Admit: 2024-01-26 | Discharge: 2024-01-26 | Disposition: A | Discharge status: Home / Self Care | Source: Ambulatory Visit | Attending: Oncology | Admitting: Oncology

## 2024-01-26 DIAGNOSIS — M8580 Other specified disorders of bone density and structure, unspecified site: Secondary | ICD-10-CM

## 2024-01-26 DIAGNOSIS — Z780 Asymptomatic menopausal state: Secondary | ICD-10-CM | POA: Diagnosis not present

## 2024-01-27 ENCOUNTER — Other Ambulatory Visit (HOSPITAL_BASED_OUTPATIENT_CLINIC_OR_DEPARTMENT_OTHER): Admitting: Radiology

## 2024-04-13 ENCOUNTER — Encounter: Payer: Self-pay | Admitting: Gastroenterology

## 2024-12-03 ENCOUNTER — Other Ambulatory Visit

## 2024-12-03 ENCOUNTER — Ambulatory Visit: Admitting: Oncology
